# Patient Record
Sex: Male | Born: 1950 | Race: White | Hispanic: No | Marital: Single | Smoking: Never smoker
Health system: Southern US, Community
[De-identification: ages and names within clinical notes are randomized; demographics above are authoritative.]

## PROBLEM LIST (undated history)

## (undated) DIAGNOSIS — E119 Type 2 diabetes mellitus without complications: Secondary | ICD-10-CM

## (undated) DIAGNOSIS — E782 Mixed hyperlipidemia: Secondary | ICD-10-CM

## (undated) DIAGNOSIS — Z87442 Personal history of urinary calculi: Secondary | ICD-10-CM

## (undated) DIAGNOSIS — T8859XA Other complications of anesthesia, initial encounter: Secondary | ICD-10-CM

## (undated) DIAGNOSIS — I10 Essential (primary) hypertension: Secondary | ICD-10-CM

## (undated) DIAGNOSIS — T4145XA Adverse effect of unspecified anesthetic, initial encounter: Secondary | ICD-10-CM

## (undated) DIAGNOSIS — K219 Gastro-esophageal reflux disease without esophagitis: Secondary | ICD-10-CM

## (undated) DIAGNOSIS — M109 Gout, unspecified: Secondary | ICD-10-CM

## (undated) HISTORY — PX: WRIST SURGERY: SHX841

## (undated) HISTORY — PX: CHOLECYSTECTOMY: SHX55

---

## 1998-07-20 HISTORY — PX: CYSTOSCOPY W/ URETERAL STENT PLACEMENT: SHX1429

## 2004-04-24 ENCOUNTER — Emergency Department: Payer: Self-pay | Admitting: Internal Medicine

## 2005-06-17 ENCOUNTER — Emergency Department: Payer: Self-pay | Admitting: Internal Medicine

## 2006-07-15 ENCOUNTER — Emergency Department: Payer: Self-pay | Admitting: Emergency Medicine

## 2006-07-21 ENCOUNTER — Ambulatory Visit: Payer: Self-pay | Admitting: General Practice

## 2006-11-29 ENCOUNTER — Ambulatory Visit: Payer: Self-pay | Admitting: Pain Medicine

## 2006-12-02 ENCOUNTER — Ambulatory Visit: Payer: Self-pay | Admitting: Pain Medicine

## 2006-12-22 ENCOUNTER — Ambulatory Visit: Payer: Self-pay | Admitting: Pain Medicine

## 2007-02-07 ENCOUNTER — Ambulatory Visit: Payer: Self-pay | Admitting: Pain Medicine

## 2007-02-22 ENCOUNTER — Ambulatory Visit: Payer: Self-pay | Admitting: Pain Medicine

## 2007-03-08 ENCOUNTER — Ambulatory Visit: Payer: Self-pay | Admitting: Physician Assistant

## 2007-03-15 ENCOUNTER — Ambulatory Visit: Payer: Self-pay | Admitting: Pain Medicine

## 2007-03-31 ENCOUNTER — Ambulatory Visit: Payer: Self-pay | Admitting: Pain Medicine

## 2007-04-11 ENCOUNTER — Ambulatory Visit: Payer: Self-pay | Admitting: Pain Medicine

## 2007-05-11 ENCOUNTER — Ambulatory Visit: Payer: Self-pay | Admitting: Pain Medicine

## 2008-04-01 ENCOUNTER — Emergency Department: Payer: Self-pay | Admitting: Emergency Medicine

## 2008-08-22 ENCOUNTER — Emergency Department: Payer: Self-pay | Admitting: Emergency Medicine

## 2009-08-19 ENCOUNTER — Emergency Department: Payer: Self-pay | Admitting: Unknown Physician Specialty

## 2009-09-02 ENCOUNTER — Emergency Department: Payer: Self-pay | Admitting: Emergency Medicine

## 2009-09-06 ENCOUNTER — Ambulatory Visit: Payer: Self-pay | Admitting: Physician Assistant

## 2009-09-27 ENCOUNTER — Inpatient Hospital Stay: Payer: Self-pay | Admitting: Internal Medicine

## 2009-10-05 ENCOUNTER — Inpatient Hospital Stay: Payer: Self-pay | Admitting: Internal Medicine

## 2014-08-31 ENCOUNTER — Emergency Department: Payer: Self-pay | Admitting: Emergency Medicine

## 2014-09-03 ENCOUNTER — Emergency Department: Payer: Self-pay | Admitting: Student

## 2014-09-05 ENCOUNTER — Emergency Department: Payer: Self-pay | Admitting: Emergency Medicine

## 2014-09-07 ENCOUNTER — Emergency Department: Payer: Self-pay | Admitting: Emergency Medicine

## 2016-09-02 ENCOUNTER — Emergency Department: Payer: Self-pay

## 2016-09-02 ENCOUNTER — Emergency Department
Admission: EM | Admit: 2016-09-02 | Discharge: 2016-09-02 | Disposition: A | Discharge status: Home / Self Care | Payer: Self-pay | Attending: Emergency Medicine | Admitting: Emergency Medicine

## 2016-09-02 ENCOUNTER — Encounter: Payer: Self-pay | Admitting: Emergency Medicine

## 2016-09-02 DIAGNOSIS — N200 Calculus of kidney: Secondary | ICD-10-CM | POA: Insufficient documentation

## 2016-09-02 LAB — BASIC METABOLIC PANEL
Anion gap: 5 (ref 5–15)
BUN: 17 mg/dL (ref 6–20)
CALCIUM: 8.8 mg/dL — AB (ref 8.9–10.3)
CO2: 28 mmol/L (ref 22–32)
Chloride: 107 mmol/L (ref 101–111)
Creatinine, Ser: 1.19 mg/dL (ref 0.61–1.24)
GFR calc Af Amer: >60 mL/min (ref >60)
GLUCOSE: 245 mg/dL — AB (ref 65–99)
POTASSIUM: 4.4 mmol/L (ref 3.5–5.1)
SODIUM: 140 mmol/L (ref 135–145)

## 2016-09-02 LAB — CBC
HCT: 43.1 % (ref 40.0–52.0)
Hemoglobin: 14.6 g/dL (ref 13.0–18.0)
MCH: 31.4 pg (ref 26.0–34.0)
MCHC: 33.8 g/dL (ref 32.0–36.0)
MCV: 93.1 fL (ref 80.0–100.0)
PLATELETS: 279 10*3/uL (ref 150–440)
RBC: 4.63 MIL/uL (ref 4.40–5.90)
RDW: 13.9 % (ref 11.5–14.5)
WBC: 9.4 10*3/uL (ref 3.8–10.6)

## 2016-09-02 LAB — URINALYSIS, COMPLETE (UACMP) WITH MICROSCOPIC
BILIRUBIN URINE: NEGATIVE
Bacteria, UA: NONE SEEN
GLUCOSE, UA: 150 mg/dL — AB
KETONES UR: NEGATIVE mg/dL
LEUKOCYTES UA: NEGATIVE
NITRITE: NEGATIVE
PH: 7 (ref 5.0–8.0)
Protein, ur: NEGATIVE mg/dL
SPECIFIC GRAVITY, URINE: 1.012 (ref 1.005–1.030)
Squamous Epithelial / LPF: NONE SEEN

## 2016-09-02 MED ORDER — KETOROLAC TROMETHAMINE 30 MG/ML IJ SOLN
30.0000 mg | Freq: Once | INTRAMUSCULAR | Status: AC
  Administered 2016-09-02: 30 mg via INTRAVENOUS

## 2016-09-02 MED ORDER — SODIUM CHLORIDE 0.9 % IV BOLUS (SEPSIS)
1000.0000 mL | Freq: Once | INTRAVENOUS | Status: AC
  Administered 2016-09-02: 1000 mL via INTRAVENOUS

## 2016-09-02 MED ORDER — TAMSULOSIN HCL 0.4 MG PO CAPS: 0.4000 mg | ORAL_CAPSULE | Freq: Every day | ORAL | 0 refills | Status: DC

## 2016-09-02 MED ORDER — ONDANSETRON HCL 4 MG/2ML IJ SOLN
INTRAMUSCULAR | Status: AC
  Administered 2016-09-02: 4 mg via INTRAVENOUS
  Filled 2016-09-02: qty 2

## 2016-09-02 MED ORDER — MORPHINE SULFATE (PF) 2 MG/ML IV SOLN
INTRAVENOUS | Status: AC
  Administered 2016-09-02: 2 mg via INTRAVENOUS
  Filled 2016-09-02: qty 1

## 2016-09-02 MED ORDER — ONDANSETRON HCL 4 MG PO TABS: 4.0000 mg | ORAL_TABLET | Freq: Every day | ORAL | 1 refills | Status: AC | PRN

## 2016-09-02 MED ORDER — MORPHINE SULFATE (PF) 2 MG/ML IV SOLN
2.0000 mg | Freq: Once | INTRAVENOUS | Status: AC
  Administered 2016-09-02: 2 mg via INTRAVENOUS

## 2016-09-02 MED ORDER — OXYCODONE-ACETAMINOPHEN 5-325 MG PO TABS: 1.0000 | ORAL_TABLET | ORAL | 0 refills | Status: DC | PRN

## 2016-09-02 MED ORDER — ONDANSETRON HCL 4 MG/2ML IJ SOLN
4.0000 mg | Freq: Once | INTRAMUSCULAR | Status: AC
  Administered 2016-09-02: 4 mg via INTRAVENOUS

## 2016-09-02 MED ORDER — KETOROLAC TROMETHAMINE 30 MG/ML IJ SOLN
INTRAMUSCULAR | Status: AC
  Administered 2016-09-02: 30 mg via INTRAVENOUS
  Filled 2016-09-02: qty 1

## 2016-09-02 NOTE — ED Notes (Signed)
ED Provider at bedside. 

## 2016-09-02 NOTE — ED Provider Notes (Signed)
[facility] Emergency Department Provider Note   First MD Initiated Contact with Patient 09/02/16 0411     (approximate)  I have reviewed the triage vital signs and the nursing notes.   HISTORY  Chief Complaint Flank Pain    HPI Phillip Galloway is a 66 y.o. male with history of kidney stone presents to the emergency department with acute onset of left flank pain at 11:30 PM tonight. Patient also admits to nausea and one episode of nonbloody emesis. Patient denies any hematuria dysuria or fever. Patient states his current pain score is 8 out of 10.   Past Medical History:  Diagnosis Date  . Kidney stone     There are no active problems to display for this patient.   Past Surgical History:  Procedure Laterality Date  . CHOLECYSTECTOMY      Prior to Admission medications   Medication Sig Start Date End Date Taking? Authorizing Provider  ondansetron (ZOFRAN) 4 MG tablet Take 1 tablet (4 mg total) by mouth daily as needed for nausea or vomiting. 09/02/16 09/02/17  Gregor Hams, MD  oxyCODONE-acetaminophen (ROXICET) 5-325 MG tablet Take 1 tablet by mouth every 4 (four) hours as needed for severe pain. 09/02/16   Gregor Hams, MD  tamsulosin [facility]) 0.4 MG CAPS capsule Take 1 capsule (0.4 mg total) by mouth daily after breakfast. 09/02/16   Gregor Hams, MD    Allergies Darvon [propoxyphene]  No family history on file.  Social History Social History  Substance Use Topics  . Smoking status: Never Smoker  . Smokeless tobacco: Never Used  . Alcohol use No    Review of Systems Constitutional: No fever/chills Eyes: No visual changes. ENT: No sore throat. Cardiovascular: Denies chest pain. Respiratory: Denies shortness of breath. Gastrointestinal:Positive for left flank pain and vomiting  No diarrhea.  No constipation. Genitourinary: Negative for dysuria. Musculoskeletal: Positive for left flank pain Skin: Negative for  rash. Neurological: Negative for headaches, focal weakness or numbness.  10-point ROS otherwise negative.  ____________________________________________   PHYSICAL EXAM:  VITAL SIGNS: ED Triage Vitals  Enc Vitals Group     BP 09/02/16 0307 (!) 161/88     Pulse Rate 09/02/16 0307 78     Resp 09/02/16 0307 16     Temp 09/02/16 0307 98.3 F (36.8 C)     Temp Source 09/02/16 0307 Oral     SpO2 09/02/16 0307 96 %     Weight 09/02/16 0305 220 lb (99.8 kg)     Height 09/02/16 0305 5\' 9"  ([id number] m)     Head Circumference --      Peak Flow --      Pain Score 09/02/16 0305 4     Pain Loc --      Pain Edu? --      Excl. in [place]? --     Constitutional: Alert and oriented. Apparent discomfort Eyes: Conjunctivae are normal. PERRL. EOMI. Head: Atraumatic. Mouth/Throat: Mucous membranes are moist Neck: No stridor.   Cardiovascular: Normal rate, regular rhythm. Good peripheral circulation. Grossly normal heart sounds. Respiratory: Normal respiratory effort.  No retractions. Lungs CTAB. Gastrointestinal: Soft and nontender. No distention.  Musculoskeletal: No lower extremity tenderness nor edema. No gross deformities of extremities. Neurologic:  Normal speech and language. No gross focal neurologic deficits are appreciated.  Skin:  Skin is warm, dry and intact. No rash noted.  ____________________________________________   LABS (all labs ordered are listed, but only abnormal results are displayed)  Labs Reviewed  BASIC METABOLIC PANEL - Abnormal; Notable for the following:       Result Value   Glucose, Bld 245 (*)    Calcium 8.8 (*)    All other components within normal limits  URINALYSIS, COMPLETE (UACMP) WITH MICROSCOPIC - Abnormal; Notable for the following:    Color, Urine STRAW (*)    APPearance CLEAR (*)    Glucose, UA 150 (*)    Hgb urine dipstick LARGE (*)    All other components within normal limits  CBC    RADIOLOGY I, [place] Airen Stiehl, personally viewed and  evaluated these images (plain radiographs) as part of my medical decision making, as well as reviewing the written report by the radiologist.  Ct Renal Stone Study  Result Date: 09/02/2016 CLINICAL DATA:  Left flank pain radiating to the abdomen. Nausea. Previous history of kidney stones. EXAM: CT ABDOMEN AND PELVIS WITHOUT CONTRAST TECHNIQUE: Multidetector CT imaging of the abdomen and pelvis was performed following the standard protocol without IV contrast. COMPARISON:  10/05/2009 FINDINGS: Lower chest: The lung bases are clear. Hepatobiliary: No focal liver abnormality is seen. Status post cholecystectomy. No biliary dilatation. Pancreas: Unremarkable. No pancreatic ductal dilatation or surrounding inflammatory changes. Spleen: Normal in size without focal abnormality. Adrenals/Urinary Tract: No adrenal gland nodules. There is a stone in the bladder at the ureterovesical junction. Stone measures 3 mm in diameter. There is proximal left hydronephrosis and hydroureter with stranding around the left kidney and ureter. Multiple bilateral intrarenal stones. Right renal collecting system and ureter are decompressed. Bladder wall is not thickened. Multiple exophytic cysts on the right kidney without change. Stomach/Bowel: Stomach and small bowel are mostly decompressed. Scattered stool throughout the colon without abnormal distention. Diverticulosis of the sigmoid colon without evidence of diverticulitis. The appendix is normal. Vascular/Lymphatic: Aortic atherosclerosis. No enlarged abdominal or pelvic lymph nodes. Reproductive: Prostate gland is enlarged, measuring 5.1 cm diameter. Prostate calcifications are present. Other: No abdominal wall hernia or abnormality. No abdominopelvic ascites. Musculoskeletal: No acute or significant osseous findings. IMPRESSION: 3 mm stone at the left ureterovesical junction with moderate proximal obstruction. Multiple bilateral intrarenal nonobstructing stones. Prostate gland is  enlarged. Electronically Signed   By: Lucienne Capers M.D.   On: 09/02/2016 03:29     Procedures    INITIAL IMPRESSION / ASSESSMENT AND PLAN / ED COURSE  Pertinent labs & imaging results that were available during my care of the patient were reviewed by me and considered in my medical decision making (see chart for details).  66 year old male presented with history of physical exam consistent with possible kidney stone which was confirmed with CT scan. Patient received IV morphine and Toradol and Zofran with resolution of pain in the emergency department will be for referred to urology for further outpatient evaluation and management      ____________________________________________  FINAL CLINICAL IMPRESSION(S) / ED DIAGNOSES  Final diagnoses:  Kidney stone on left side     MEDICATIONS GIVEN DURING THIS VISIT:  Medications  morphine 2 MG/ML injection 2 mg (2 mg Intravenous Given 09/02/16 0452)  ketorolac (TORADOL) 30 MG/ML injection 30 mg (30 mg Intravenous Given 09/02/16 0451)  ondansetron (ZOFRAN) injection 4 mg (4 mg Intravenous Given 09/02/16 0452)  sodium chloride 0.9 % bolus 1,000 mL (0 mLs Intravenous Stopped 09/02/16 0644)     NEW OUTPATIENT MEDICATIONS STARTED DURING THIS VISIT:  New Prescriptions   ONDANSETRON (ZOFRAN) 4 MG TABLET    Take 1 tablet (4 mg total) by mouth  daily as needed for nausea or vomiting.   OXYCODONE-ACETAMINOPHEN (ROXICET) 5-325 MG TABLET    Take 1 tablet by mouth every 4 (four) hours as needed for severe pain.   TAMSULOSIN (FLOMAX) 0.4 MG CAPS CAPSULE    Take 1 capsule (0.4 mg total) by mouth daily after breakfast.    Modified Medications   No medications on file    Discontinued Medications   No medications on file     Note:  This document was prepared using Dragon voice recognition software and may include unintentional dictation errors.    Gregor Hams, MD 09/02/16 [phone]

## 2016-09-02 NOTE — ED Notes (Signed)
Patient has sleep apnea worse with laying on his back which was how he was sleeping.

## 2016-09-02 NOTE — ED Notes (Signed)
Patient transported to CT at this time. 

## 2016-09-02 NOTE — ED Triage Notes (Signed)
Patient ambulatory to triage with steady gait, without difficulty or distress noted; pt reports left flank pain radiating around to abd x hr accomp by nausea; st hx kidney stone

## 2016-09-02 NOTE — ED Notes (Signed)
Asked patient for urine specimen. Unable to provide at this time.

## 2016-11-27 ENCOUNTER — Emergency Department: Payer: Self-pay

## 2016-11-27 ENCOUNTER — Emergency Department
Admission: EM | Admit: 2016-11-27 | Discharge: 2016-11-27 | Disposition: A | Discharge status: Home / Self Care | Payer: Self-pay | Attending: Emergency Medicine | Admitting: Emergency Medicine

## 2016-11-27 ENCOUNTER — Encounter: Payer: Self-pay | Admitting: Emergency Medicine

## 2016-11-27 DIAGNOSIS — Y929 Unspecified place or not applicable: Secondary | ICD-10-CM | POA: Insufficient documentation

## 2016-11-27 DIAGNOSIS — X503XXA Overexertion from repetitive movements, initial encounter: Secondary | ICD-10-CM | POA: Insufficient documentation

## 2016-11-27 DIAGNOSIS — Y999 Unspecified external cause status: Secondary | ICD-10-CM | POA: Insufficient documentation

## 2016-11-27 DIAGNOSIS — Y939 Activity, unspecified: Secondary | ICD-10-CM | POA: Insufficient documentation

## 2016-11-27 DIAGNOSIS — S161XXA Strain of muscle, fascia and tendon at neck level, initial encounter: Secondary | ICD-10-CM | POA: Insufficient documentation

## 2016-11-27 DIAGNOSIS — R202 Paresthesia of skin: Secondary | ICD-10-CM

## 2016-11-27 MED ORDER — CYCLOBENZAPRINE HCL 5 MG PO TABS: 5.0000 mg | ORAL_TABLET | Freq: Three times a day (TID) | ORAL | 0 refills | Status: DC | PRN

## 2016-11-27 MED ORDER — DEXAMETHASONE SODIUM PHOSPHATE 10 MG/ML IJ SOLN
10.0000 mg | Freq: Once | INTRAMUSCULAR | Status: AC
  Administered 2016-11-27: 10 mg via INTRAMUSCULAR
  Filled 2016-11-27: qty 1

## 2016-11-27 MED ORDER — PREDNISONE 10 MG PO TABS: 10.0000 mg | ORAL_TABLET | Freq: Every day | ORAL | 0 refills | Status: DC

## 2016-11-27 NOTE — Discharge Instructions (Signed)
Your symptoms are consistent with a neck and upper back muscle strain. This has likely aggravated the nerves in the neck affecting the upper arm and hand. Take the prescription meds as directed. Apply ice or moist heat to reduce symptoms. Follow-up with [facility] or return to the ED as needed.

## 2016-11-27 NOTE — ED Provider Notes (Signed)
[facility] Emergency Department Provider Note ____________________________________________  Time seen: 83  I have reviewed the triage vital signs and the nursing notes.  HISTORY  Chief Complaint  Neck Pain and Numbness  HPI Phillip Galloway is a 66 y.o. male Presents to the ED for evaluation of right upper extremity paresthesias with pain to the scapular thoracic region of the right neck. He describes onset after he was reportedly usinga hand grinder for a prolonged period. He reports sanding with the arm extended. He reports distal paresthesias with a tingling along the thumb and first two fingers. He denies any grip changes or swelling. He denies any fevers, chills, sweats, or chest pain.   Past Medical History:  Diagnosis Date  . Kidney stone     There are no active problems to display for this patient.   Past Surgical History:  Procedure Laterality Date  . CHOLECYSTECTOMY      Prior to Admission medications   Medication Sig Start Date End Date Taking? Authorizing Provider  cyclobenzaprine (FLEXERIL) 5 MG tablet Take 1 tablet (5 mg total) by mouth 3 (three) times daily as needed for muscle spasms. 11/27/16   Javares Kaufhold, Dannielle Karvonen, PA-C  ondansetron (ZOFRAN) 4 MG tablet Take 1 tablet (4 mg total) by mouth daily as needed for nausea or vomiting. 09/02/16 09/02/17  Gregor Hams, MD  oxyCODONE-acetaminophen (ROXICET) 5-325 MG tablet Take 1 tablet by mouth every 4 (four) hours as needed for severe pain. 09/02/16   Gregor Hams, MD  predniSONE (DELTASONE) 10 MG tablet Take 1 tablet (10 mg total) by mouth daily with breakfast. 11/27/16   Aloysious Vangieson, Dannielle Karvonen, PA-C  tamsulosin (FLOMAX) 0.4 MG CAPS capsule Take 1 capsule (0.4 mg total) by mouth daily after breakfast. 09/02/16   Gregor Hams, MD    Allergies Darvon [propoxyphene]  History reviewed. No pertinent family history.  Social History Social History  Substance Use Topics  . Smoking  status: Never Smoker  . Smokeless tobacco: Never Used  . Alcohol use No    Review of Systems  Constitutional: Negative for fever. Cardiovascular: Negative for chest pain. Respiratory: Negative for shortness of breath. Musculoskeletal: Positive for upper back pain. Skin: Negative for rash. Neurological: Negative for headaches, focal weakness or numbness. Distal RUE paresthesias as above ____________________________________________  PHYSICAL EXAM:  VITAL SIGNS: ED Triage Vitals  Enc Vitals Group     BP 11/27/16 1820 (!) 153/96     Pulse Rate 11/27/16 1818 79     Resp 11/27/16 1818 18     Temp 11/27/16 1818 98.8 F (37.1 C)     Temp Source 11/27/16 1818 Oral     SpO2 11/27/16 1818 98 %     Weight 11/27/16 1819 210 lb (95.3 kg)     Height 11/27/16 1819 5\' 9"  (1.753 m)     Head Circumference --      Peak Flow --      Pain Score --      Pain Loc --      Pain Edu? --      Excl. in [place]? --    Constitutional: Alert and oriented. Well appearing and in no distress. Head: Normocephalic and atraumatic. Neck: Supple. No thyromegaly. Slightly decreased right rotation though range of motion. No crepitus appreciated. Cardiovascular: Normal rate, regular rhythm. Normal distal pulses. Respiratory: Normal respiratory effort. No wheezes/rales/rhonchi. Musculoskeletal: Normal spinal alignment without midline tenderness, spasm, deformity, step-off. Patient tender palpation to the right scapulothoracic region  between the thoracic spine and the right scapula border. Normal rotator cuff testing on exam. Normal composite fist bilaterally. Nontender with normal range of motion in all extremities.  Neurologic:  Normal gross sensation. Normal grip strength bilaterally. Normal UE DTRs bilaterally. Normal intrinsic and opposition testing. Normal speech and language. No gross focal neurologic deficits are appreciated. ____________________________________________   RADIOLOGY  Cervical  Spine IMPRESSION: Negative cervical spine radiographs.  I, Quantarius Genrich, Dannielle Karvonen, personally viewed and evaluated these images (plain radiographs) as part of my medical decision making, as well as reviewing the written report by the radiologist. ____________________________________________  PROCEDURES  Decadron 10 mg IM ____________________________________________  INITIAL IMPRESSION / ASSESSMENT AND PLAN / ED COURSE  Patient with right scapular thoracic muscle strain with distal paresthesias on presentation. He is reassured by his negative findings films. He has no significant weakness on exam. He'll be discharged with a prescription for prednisone and Flexeril doses directed. He is advised to follow with his provider at [facility] clinic for ongoing symptom management. Return precautions were reviewed. ____________________________________________  FINAL CLINICAL IMPRESSION(S) / ED DIAGNOSES  Final diagnoses:  Acute strain of neck muscle, initial encounter  Paresthesias      Carmie End, Dannielle Karvonen, PA-C 11/27/16 2016    Earleen Newport, MD 11/27/16 2039

## 2016-11-27 NOTE — ED Notes (Signed)
Reviewed d/c instructions, follow-up care, and prescriptions with patient. Pt verbalized understanding.

## 2016-11-27 NOTE — ED Triage Notes (Signed)
Pt reports having "crick" in neck that started a week ago.  Pain in right neck and shoulder and has now has tingling that started monday. Pain much worse when turns head to right or looks up and to right.  Tingling has improved per pt but still has a lot of pain when turning head.

## 2016-11-27 NOTE — ED Notes (Addendum)
See triage note, pt reports neck pain that began last Saturday and has since gotten worse as well as new onset of right arm pain with a tingling sensation. Pt denies injury. Pt has ROM to bilateral arms at this time. Pt reports tilting his head back and turning head to the right increases the pain. Pt A&O and in NAD at this time.

## 2017-10-25 ENCOUNTER — Emergency Department: Payer: Medicare Other

## 2017-10-25 ENCOUNTER — Emergency Department
Admission: EM | Admit: 2017-10-25 | Discharge: 2017-10-25 | Disposition: A | Discharge status: Home / Self Care | Payer: Medicare Other | Attending: Emergency Medicine | Admitting: Emergency Medicine

## 2017-10-25 ENCOUNTER — Encounter: Payer: Self-pay | Admitting: Emergency Medicine

## 2017-10-25 DIAGNOSIS — M25461 Effusion, right knee: Secondary | ICD-10-CM | POA: Diagnosis not present

## 2017-10-25 DIAGNOSIS — Z79899 Other long term (current) drug therapy: Secondary | ICD-10-CM | POA: Insufficient documentation

## 2017-10-25 DIAGNOSIS — R2241 Localized swelling, mass and lump, right lower limb: Secondary | ICD-10-CM | POA: Diagnosis present

## 2017-10-25 DIAGNOSIS — L539 Erythematous condition, unspecified: Secondary | ICD-10-CM

## 2017-10-25 LAB — CBC WITH DIFFERENTIAL/PLATELET
Basophils Absolute: 0.1 10*3/uL (ref 0–0.1)
Basophils Relative: 1 %
EOS PCT: 2 %
Eosinophils Absolute: 0.2 10*3/uL (ref 0–0.7)
HCT: 41.5 % (ref 40.0–52.0)
HEMOGLOBIN: 14 g/dL (ref 13.0–18.0)
LYMPHS ABS: 1.5 10*3/uL (ref 1.0–3.6)
LYMPHS PCT: 16 %
MCH: 30.9 pg (ref 26.0–34.0)
MCHC: 33.6 g/dL (ref 32.0–36.0)
MCV: 91.7 fL (ref 80.0–100.0)
Monocytes Absolute: 1.1 10*3/uL — ABNORMAL HIGH (ref 0.2–1.0)
Monocytes Relative: 12 %
NEUTROS PCT: 69 %
Neutro Abs: 6.7 10*3/uL — ABNORMAL HIGH (ref 1.4–6.5)
Platelets: 328 10*3/uL (ref 150–440)
RBC: 4.52 MIL/uL (ref 4.40–5.90)
RDW: 13.5 % (ref 11.5–14.5)
WBC: 9.5 10*3/uL (ref 3.8–10.6)

## 2017-10-25 LAB — BASIC METABOLIC PANEL
Anion gap: 6 (ref 5–15)
BUN: 14 mg/dL (ref 6–20)
CHLORIDE: 105 mmol/L (ref 101–111)
CO2: 26 mmol/L (ref 22–32)
Calcium: 8.6 mg/dL — ABNORMAL LOW (ref 8.9–10.3)
Creatinine, Ser: 1.01 mg/dL (ref 0.61–1.24)
GFR calc Af Amer: >60 mL/min (ref >60)
GFR calc non Af Amer: >60 mL/min (ref >60)
GLUCOSE: 111 mg/dL — AB (ref 65–99)
POTASSIUM: 4.1 mmol/L (ref 3.5–5.1)
Sodium: 137 mmol/L (ref 135–145)

## 2017-10-25 LAB — URIC ACID: Uric Acid, Serum: 6.4 mg/dL (ref 4.4–7.6)

## 2017-10-25 MED ORDER — NAPROXEN 375 MG PO TABS: 375.0000 mg | ORAL_TABLET | Freq: Two times a day (BID) | ORAL | 0 refills | Status: DC

## 2017-10-25 MED ORDER — CLINDAMYCIN PHOSPHATE 600 MG/50ML IV SOLN
600.0000 mg | Freq: Once | INTRAVENOUS | Status: AC
  Administered 2017-10-25: 600 mg via INTRAVENOUS
  Filled 2017-10-25: qty 50

## 2017-10-25 MED ORDER — KETOROLAC TROMETHAMINE 30 MG/ML IJ SOLN
30.0000 mg | Freq: Once | INTRAMUSCULAR | Status: AC
  Administered 2017-10-25: 30 mg via INTRAVENOUS
  Filled 2017-10-25: qty 1

## 2017-10-25 MED ORDER — CLINDAMYCIN HCL 150 MG PO CAPS: 150.0000 mg | ORAL_CAPSULE | Freq: Four times a day (QID) | ORAL | 0 refills | Status: DC

## 2017-10-25 NOTE — ED Triage Notes (Signed)
Pt reports thinks he may have gotten bit by a spider in his right knee. C/o swelling and pain since last Monday.

## 2017-10-25 NOTE — ED Provider Notes (Signed)
[facility] Emergency Department Provider Note   ____________________________________________   First MD Initiated Contact with Patient 10/25/17 1345     (approximate)  I have reviewed the triage vital signs and the nursing notes.   HISTORY  Chief Complaint Knee Pain    HPI Phillip Galloway is a 67 y.o. male patient presents with right knee edema and erythema.  Patient increased swelling and pain for 1 week.  Patient suspect insect bite.  Patient has a history of gout.  Patient denies fever this complaint.  Patient denies any drainage.  Patient rates pain 6/10.  Patient described the pain "achy".  No palliative measures for complaint.  Past Medical History:  Diagnosis Date  . Kidney stone     There are no active problems to display for this patient.   Past Surgical History:  Procedure Laterality Date  . CHOLECYSTECTOMY      Prior to Admission medications   Medication Sig Start Date End Date Taking? Authorizing Provider  clindamycin (CLEOCIN) 150 MG capsule Take 1 capsule (150 mg total) by mouth 4 (four) times daily. 10/25/17   Sable Feil, PA-C  cyclobenzaprine (FLEXERIL) 5 MG tablet Take 1 tablet (5 mg total) by mouth 3 (three) times daily as needed for muscle spasms. 11/27/16   Menshew, Dannielle Karvonen, PA-C  naproxen (NAPROSYN) 375 MG tablet Take 1 tablet (375 mg total) by mouth 2 (two) times daily with a meal. 10/25/17   Sable Feil, PA-C  oxyCODONE-acetaminophen (ROXICET) 5-325 MG tablet Take 1 tablet by mouth every 4 (four) hours as needed for severe pain. 09/02/16   Gregor Hams, MD  predniSONE (DELTASONE) 10 MG tablet Take 1 tablet (10 mg total) by mouth daily with breakfast. 11/27/16   Menshew, Dannielle Karvonen, PA-C  tamsulosin (FLOMAX) 0.4 MG CAPS capsule Take 1 capsule (0.4 mg total) by mouth daily after breakfast. 09/02/16   Gregor Hams, MD    Allergies Darvon [propoxyphene]  No family history on file.  Social History Social  History   Tobacco Use  . Smoking status: Never Smoker  . Smokeless tobacco: Never Used  Substance Use Topics  . Alcohol use: No  . Drug use: Not on file    Review of Systems Constitutional: No fever/chills Eyes: No visual changes. ENT: No sore throat. Cardiovascular: Denies chest pain. Respiratory: Denies shortness of breath. Gastrointestinal: No abdominal pain.  No nausea, no vomiting.  No diarrhea.  No constipation. Genitourinary: Negative for dysuria. Musculoskeletal: Right knee pain Skin: Negative for rash. Neurological: Negative for headaches, focal weakness or numbness. Allergic/Immunilogical: Darvon ____________________________________________   PHYSICAL EXAM:  VITAL SIGNS: ED Triage Vitals  Enc Vitals Group     BP 10/25/17 1212 (!) 148/92     Pulse Rate 10/25/17 1212 77     Resp 10/25/17 1212 20     Temp 10/25/17 1212 98.3 F (36.8 C)     Temp Source 10/25/17 1212 Oral     SpO2 10/25/17 1212 96 %     Weight 10/25/17 1211 215 lb (97.5 kg)     Height 10/25/17 1211 5\' 9"  (1.753 m)     Head Circumference --      Peak Flow --      Pain Score 10/25/17 1210 6     Pain Loc --      Pain Edu? --      Excl. in [place]? --     Constitutional: Alert and oriented. Well appearing and in  no acute distress. Cardiovascular: Normal rate, regular rhythm. Grossly normal heart sounds.  Good peripheral circulation. Respiratory: Normal respiratory effort.  No retractions. Lungs CTAB. Musculoskeletal: Edema and erythema anterior patella of the right leg. Neurologic:  Normal speech and language. No gross focal neurologic deficits are appreciated. No gait instability. Skin: Erythematous edematous area measuring approximately 7 cm of the anterior patella.  Skin is non-fluctuant.Marland Kitchen  Psychiatric: Mood and affect are normal. Speech and behavior are normal.  ____________________________________________   LABS (all labs ordered are listed, but only abnormal results are displayed)  Labs  Reviewed  CBC WITH DIFFERENTIAL/PLATELET - Abnormal; Notable for the following components:      Result Value   Neutro Abs 6.7 (*)    Monocytes Absolute 1.1 (*)    All other components within normal limits  BASIC METABOLIC PANEL - Abnormal; Notable for the following components:   Glucose, Bld 111 (*)    Calcium 8.6 (*)    All other components within normal limits  URIC ACID   ____________________________________________  EKG   ____________________________________________  RADIOLOGY    Official radiology report(s): [place] Rt Lower Extrem Ltd Soft Tissue Non Vascular  Result Date: 10/25/2017 CLINICAL DATA:  67 year old male with 1 week of erythema and swelling along the right knee/patella. EXAM: ULTRASOUND RIGHT LOWER EXTREMITY LIMITED TECHNIQUE: Ultrasound examination of the lower extremity soft tissues was performed in the area of clinical concern. COMPARISON:  No prior right knee imaging. FINDINGS: Grayscale and brief color Doppler interrogation of the soft tissues in the area of clinical concern along the anterior right knee. Evidence of widespread soft tissue edema, but no discrete fluid collection or lesion is identified with ultrasound. IMPRESSION: Generalized soft tissue edema. No discrete fluid collection/abscess. Electronically Signed   By: Genevie Ann M.D.   On: 10/25/2017 14:34    ____________________________________________   PROCEDURES  Procedure(s) performed: None  Procedures  Critical Care performed: No  ____________________________________________   INITIAL IMPRESSION / ASSESSMENT AND PLAN / ED COURSE  As part of my medical decision making, I reviewed the following data within the [place]    Patient presents with increasing edema erythema to the right knee for 1 week.  Differential diagnosis consists of gout, knee effusion, and cellulitis.  Discussed labs and imaging findings with patient.  Patient given discharge care instruction advised to  follow orthopedics for definitive evaluation and treatment.  Patient discharged with clindamycin and naproxen after receiving clindamycin IV prior to departure.      ____________________________________________   FINAL CLINICAL IMPRESSION(S) / ED DIAGNOSES  Final diagnoses:  Effusion of right knee     ED Discharge Orders        Ordered    clindamycin (CLEOCIN) 150 MG capsule  4 times daily     10/25/17 1528    naproxen (NAPROSYN) 375 MG tablet  2 times daily with meals     10/25/17 1528       Note:  This document was prepared using Dragon voice recognition software and may include unintentional dictation errors.    Sable Feil, PA-C 10/25/17 1533    Harvest Dark, MD 10/26/17 1517

## 2017-10-25 NOTE — Discharge Instructions (Addendum)
Take medication as directed and follow-up on-call orthopedic department in the morning.

## 2017-10-25 NOTE — ED Notes (Signed)
Patient transported to Ultrasound 

## 2017-10-25 NOTE — ED Notes (Signed)
See triage note   Presents with right knee pain  States he thinks he have been bitten by something  Small red area to knee  Denies any injury  Ambulates with limp d/t pain

## 2017-12-28 ENCOUNTER — Inpatient Hospital Stay: Admission: RE | Admit: 2017-12-28 | Payer: Medicare Other | Source: Ambulatory Visit

## 2017-12-29 ENCOUNTER — Other Ambulatory Visit: Payer: Self-pay

## 2017-12-29 ENCOUNTER — Encounter
Admission: RE | Admit: 2017-12-29 | Discharge: 2017-12-29 | Disposition: A | Discharge status: Home / Self Care | Payer: Medicare Other | Source: Ambulatory Visit | Attending: Orthopedic Surgery | Admitting: Orthopedic Surgery

## 2017-12-29 HISTORY — DX: Gastro-esophageal reflux disease without esophagitis: K21.9

## 2017-12-29 HISTORY — DX: Personal history of urinary calculi: Z87.442

## 2017-12-29 HISTORY — DX: Adverse effect of unspecified anesthetic, initial encounter: T41.45XA

## 2017-12-29 HISTORY — DX: Other complications of anesthesia, initial encounter: T88.59XA

## 2017-12-29 NOTE — Patient Instructions (Signed)
Your procedure is scheduled on: 01-06-18 THURSDAY Report to Same Day Surgery 2nd floor medical mall [facility] Entrance-take elevator on left to 2nd floor.  Check in with surgery information desk.) To find out your arrival time please call [phone] between 1PM - 3PM on 01-05-18 [facility]  Remember: Instructions that are not followed completely may result in serious medical risk, up to and including death, or upon the discretion of your surgeon and anesthesiologist your surgery may need to be rescheduled.    _x___ 1. Do not eat food after midnight the night before your procedure. NO GUM OR CANDY AFTER MIDNIGHT.  You may drink clear liquids up to 2 hours before you are scheduled to arrive at the hospital for your procedure.  Do not drink clear liquids within 2 hours of your scheduled arrival to the hospital.  Clear liquids include  --Water or Apple juice without pulp  --Clear carbohydrate beverage such as ClearFast or Gatorade  --Black Coffee or Clear Tea (No milk, no creamers, do not add anything to the coffee or Tea     __x__ 2. No Alcohol for 24 hours before or after surgery.   __x__3. No Smoking or e-cigarettes for 24 prior to surgery.  Do not use any chewable tobacco products for at least 6 hour prior to surgery   ____  4. Bring all medications with you on the day of surgery if instructed.    __x__ 5. Notify your doctor if there is any change in your medical condition     (cold, fever, infections).    x___6. On the morning of surgery brush your teeth with toothpaste and water.  You may rinse your mouth with mouth wash if you wish.  Do not swallow any toothpaste or mouthwash.   Do not wear jewelry, make-up, hairpins, clips or nail polish.  Do not wear lotions, powders, or perfumes. You may wear deodorant.  Do not shave 48 hours prior to surgery. Men may shave face and neck.  Do not bring valuables to the hospital.    [facility] is not responsible for any belongings or  valuables.               Contacts, dentures or bridgework may not be worn into surgery.  Leave your suitcase in the car. After surgery it may be brought to your room.  For patients admitted to the hospital, discharge time is determined by your treatment team.  _  Patients discharged the day of surgery will not be allowed to drive home.  You will need someone to drive you home and stay with you the night of your procedure.    Please read over the following fact sheets that you were given:   [facility] Preparing for Surgery and or MRSA Information   ____ Take anti-hypertensive listed below, cardiac, seizure, asthma, anti-reflux and psychiatric medicines. These include:  1. NONE  2.  3.  4.  5.  6.  ____Fleets enema or Magnesium Citrate as directed.   ____ Use CHG Soap or sage wipes as directed on instruction sheet   ____ Use inhalers on the day of surgery and bring to hospital day of surgery  ____ Stop Metformin and Janumet 2 days prior to surgery.    ____ Take 1/2 of usual insulin dose the night before surgery and none on the morning surgery.   ____ Follow recommendations from Cardiologist, Pulmonologist or PCP regarding stopping Aspirin, Coumadin, Plavix ,Eliquis, Effient, or Pradaxa, and Pletal.  X____Stop  Anti-inflammatories such as Advil, Aleve, Ibuprofen, Motrin, Naproxen, Naprosyn, Goodies powders or aspirin products NOW-OK to take Tylenol    ____ Stop supplements until after surgery.   ____ Bring C-Pap to the hospital.

## 2017-12-30 ENCOUNTER — Inpatient Hospital Stay: Admission: RE | Admit: 2017-12-30 | Payer: Medicare Other | Source: Ambulatory Visit

## 2018-01-04 ENCOUNTER — Encounter
Admission: RE | Admit: 2018-01-04 | Discharge: 2018-01-04 | Disposition: A | Discharge status: Home / Self Care | Payer: Medicare Other | Source: Ambulatory Visit | Attending: Orthopedic Surgery | Admitting: Orthopedic Surgery

## 2018-01-04 DIAGNOSIS — M7041 Prepatellar bursitis, right knee: Secondary | ICD-10-CM | POA: Diagnosis not present

## 2018-01-04 DIAGNOSIS — M71561 Other bursitis, not elsewhere classified, right knee: Secondary | ICD-10-CM | POA: Diagnosis present

## 2018-01-06 ENCOUNTER — Ambulatory Visit: Payer: Medicare Other | Admitting: Certified Registered Nurse Anesthetist

## 2018-01-06 ENCOUNTER — Ambulatory Visit
Admission: RE | Admit: 2018-01-06 | Discharge: 2018-01-06 | Disposition: A | Discharge status: Home / Self Care | Payer: Medicare Other | Source: Ambulatory Visit | Attending: Orthopedic Surgery | Admitting: Orthopedic Surgery

## 2018-01-06 ENCOUNTER — Other Ambulatory Visit: Payer: Self-pay

## 2018-01-06 ENCOUNTER — Encounter
Admission: RE | Disposition: A | Discharge status: Home / Self Care | Payer: Self-pay | Source: Ambulatory Visit | Attending: Orthopedic Surgery

## 2018-01-06 DIAGNOSIS — M7041 Prepatellar bursitis, right knee: Secondary | ICD-10-CM | POA: Insufficient documentation

## 2018-01-06 HISTORY — PX: PATELLAR TENDON REPAIR: SHX737

## 2018-01-06 SURGERY — REPAIR, TENDON, PATELLAR
Anesthesia: General | Laterality: Right

## 2018-01-06 MED ORDER — FAMOTIDINE 20 MG PO TABS
20.0000 mg | ORAL_TABLET | Freq: Once | ORAL | Status: AC
  Administered 2018-01-06: 20 mg via ORAL

## 2018-01-06 MED ORDER — CEFAZOLIN SODIUM-DEXTROSE 2-4 GM/100ML-% IV SOLN
INTRAVENOUS | Status: AC
  Filled 2018-01-06: qty 100

## 2018-01-06 MED ORDER — PROPOFOL 500 MG/50ML IV EMUL
INTRAVENOUS | Status: AC
  Filled 2018-01-06: qty 50

## 2018-01-06 MED ORDER — METOCLOPRAMIDE HCL 10 MG PO TABS: 5.0000 mg | ORAL_TABLET | Freq: Three times a day (TID) | ORAL | Status: DC | PRN

## 2018-01-06 MED ORDER — MIDAZOLAM HCL 5 MG/5ML IJ SOLN
INTRAMUSCULAR | Status: DC | PRN
  Administered 2018-01-06: 2 mg via INTRAVENOUS

## 2018-01-06 MED ORDER — HYDROCODONE-ACETAMINOPHEN 5-325 MG PO TABS: 1.0000 | ORAL_TABLET | ORAL | Status: DC | PRN

## 2018-01-06 MED ORDER — FENTANYL CITRATE (PF) 100 MCG/2ML IJ SOLN
25.0000 ug | INTRAMUSCULAR | Status: DC | PRN
  Administered 2018-01-06: 25 ug via INTRAVENOUS

## 2018-01-06 MED ORDER — KETAMINE HCL 50 MG/ML IJ SOLN
INTRAMUSCULAR | Status: DC | PRN
  Administered 2018-01-06: 50 mg via INTRAMUSCULAR

## 2018-01-06 MED ORDER — BUPIVACAINE HCL (PF) 0.5 % IJ SOLN
INTRAMUSCULAR | Status: DC | PRN
  Administered 2018-01-06: 30 mL

## 2018-01-06 MED ORDER — BUPIVACAINE HCL (PF) 0.5 % IJ SOLN
INTRAMUSCULAR | Status: AC
  Filled 2018-01-06: qty 10

## 2018-01-06 MED ORDER — DEXAMETHASONE SODIUM PHOSPHATE 10 MG/ML IJ SOLN
INTRAMUSCULAR | Status: DC | PRN
  Administered 2018-01-06: 10 mg via INTRAVENOUS

## 2018-01-06 MED ORDER — MIDAZOLAM HCL 2 MG/2ML IJ SOLN
INTRAMUSCULAR | Status: AC
Start: 2018-01-06 — End: ?
  Filled 2018-01-06: qty 2

## 2018-01-06 MED ORDER — FENTANYL CITRATE (PF) 100 MCG/2ML IJ SOLN
INTRAMUSCULAR | Status: AC
  Filled 2018-01-06: qty 2

## 2018-01-06 MED ORDER — SODIUM CHLORIDE 0.9 % IV SOLN: INTRAVENOUS | Status: DC

## 2018-01-06 MED ORDER — ONDANSETRON HCL 4 MG/2ML IJ SOLN
INTRAMUSCULAR | Status: DC | PRN
  Administered 2018-01-06: 4 mg via INTRAVENOUS

## 2018-01-06 MED ORDER — ONDANSETRON HCL 4 MG PO TABS: 4.0000 mg | ORAL_TABLET | Freq: Four times a day (QID) | ORAL | Status: DC | PRN

## 2018-01-06 MED ORDER — CEFAZOLIN SODIUM-DEXTROSE 2-4 GM/100ML-% IV SOLN
2.0000 g | Freq: Once | INTRAVENOUS | Status: AC
  Administered 2018-01-06: 2 g via INTRAVENOUS

## 2018-01-06 MED ORDER — METOCLOPRAMIDE HCL 5 MG/ML IJ SOLN: 5.0000 mg | Freq: Three times a day (TID) | INTRAMUSCULAR | Status: DC | PRN

## 2018-01-06 MED ORDER — ONDANSETRON HCL 4 MG/2ML IJ SOLN
4.0000 mg | Freq: Once | INTRAMUSCULAR | Status: AC | PRN
  Administered 2018-01-06: 4 mg via INTRAVENOUS

## 2018-01-06 MED ORDER — PROPOFOL 10 MG/ML IV BOLUS
INTRAVENOUS | Status: DC | PRN
  Administered 2018-01-06: 160 mg via INTRAVENOUS

## 2018-01-06 MED ORDER — ONDANSETRON HCL 4 MG/2ML IJ SOLN: 4.0000 mg | Freq: Four times a day (QID) | INTRAMUSCULAR | Status: DC | PRN

## 2018-01-06 MED ORDER — LACTATED RINGERS IV SOLN
INTRAVENOUS | Status: DC
  Administered 2018-01-06: 10:00:00 via INTRAVENOUS

## 2018-01-06 MED ORDER — FENTANYL CITRATE (PF) 100 MCG/2ML IJ SOLN
INTRAMUSCULAR | Status: DC | PRN
  Administered 2018-01-06: 25 ug via INTRAVENOUS

## 2018-01-06 MED ORDER — FAMOTIDINE 20 MG PO TABS
ORAL_TABLET | ORAL | Status: AC
  Administered 2018-01-06: 20 mg via ORAL
  Filled 2018-01-06: qty 1

## 2018-01-06 MED ORDER — NEOMYCIN-POLYMYXIN B GU 40-200000 IR SOLN
Status: DC | PRN
  Administered 2018-01-06: 4 mL

## 2018-01-06 MED ORDER — HYDROCODONE-ACETAMINOPHEN 5-325 MG PO TABS: 1.0000 | ORAL_TABLET | Freq: Four times a day (QID) | ORAL | 0 refills | Status: DC | PRN

## 2018-01-06 MED ORDER — ONDANSETRON HCL 4 MG/2ML IJ SOLN
INTRAMUSCULAR | Status: AC
  Filled 2018-01-06: qty 2

## 2018-01-06 SURGICAL SUPPLY — 28 items
BANDAGE ELASTIC 6 LF NS (GAUZE/BANDAGES/DRESSINGS) ×3 IMPLANT
CANISTER SUCT 1200ML W/VALVE (MISCELLANEOUS) ×3 IMPLANT
CHLORAPREP W/TINT 26ML (MISCELLANEOUS) ×3 IMPLANT
CUFF TOURN 24 STER (MISCELLANEOUS) IMPLANT
CUFF TOURN 30 STER DUAL PORT (MISCELLANEOUS) IMPLANT
ELECT CAUTERY BLADE 6.4 (BLADE) ×3 IMPLANT
ELECT REM PT RETURN 9FT ADLT (ELECTROSURGICAL) ×3
ELECTRODE REM PT RTRN 9FT ADLT (ELECTROSURGICAL) ×1 IMPLANT
GAUZE PETRO XEROFOAM 1X8 (MISCELLANEOUS) ×3 IMPLANT
GAUZE SPONGE 4X4 12PLY STRL (GAUZE/BANDAGES/DRESSINGS) ×3 IMPLANT
GLOVE SURG SYN 9.0  PF PI (GLOVE) ×2
GLOVE SURG SYN 9.0 PF PI (GLOVE) ×1 IMPLANT
GOWN SRG 2XL LVL 4 RGLN SLV (GOWNS) ×1 IMPLANT
GOWN STRL NON-REIN 2XL LVL4 (GOWNS) ×2
GOWN STRL REUS W/ TWL LRG LVL3 (GOWN DISPOSABLE) ×1 IMPLANT
GOWN STRL REUS W/TWL LRG LVL3 (GOWN DISPOSABLE) ×2
IMMOB KNEE 24 THIGH 24 443303 (SOFTGOODS) ×3 IMPLANT
KIT TURNOVER KIT A (KITS) ×3 IMPLANT
NS IRRIG 500ML POUR BTL (IV SOLUTION) ×3 IMPLANT
PACK TOTAL KNEE (MISCELLANEOUS) ×3 IMPLANT
PAD ABD DERMACEA PRESS 5X9 (GAUZE/BANDAGES/DRESSINGS) ×3 IMPLANT
STAPLER SKIN PROX 35W (STAPLE) ×3 IMPLANT
SUT VIC AB 0 CT1 27 (SUTURE) ×2
SUT VIC AB 0 CT1 27XCR 8 STRN (SUTURE) ×1 IMPLANT
SUT VIC AB 0 CT1 36 (SUTURE) ×3 IMPLANT
SUT VIC AB 2-0 CT1 27 (SUTURE) ×2
SUT VIC AB 2-0 CT1 TAPERPNT 27 (SUTURE) ×1 IMPLANT
SYR 10ML LL (SYRINGE) ×3 IMPLANT

## 2018-01-06 NOTE — H&P (Signed)
Chief Complaint  Patient presents with  . Knee Pain  Right knee pain   Phillip Galloway is a 67 y.o. male who presents today for evaluation of right knee pain. He has a history of gout. Patient has had 10-15 years of right anterior knee pain swelling. He has worked in [employer] his entire life and is performing a lot of kneeling. Occasionally he will have increased swelling to the right anterior knee that causes him discomfort. Most recently he was seen in the ER for significant swelling and redness to the anterior aspect of the right knee. Ultrasound was negative for any DVT and did not show any type of knee effusion or Baker's cyst. He was placed on clindamycin due to the redness and states today his knee is back to its baseline which is moderately swollen and slightly tender to palpation. He denies any drainage, fevers, pain to touch. Patient denies any generalized knee pain, posterior knee swelling, buckling, catching, locking, giving way sensation throughout the knee.  Past Medical History: Past Medical History:  Diagnosis Date  . Kidney stones   Past Surgical History: Past Surgical History:  Procedure Laterality Date  . CHOLECYSTECTOMY   Past Family History: Family History  Problem Relation Age of Onset  . No Known Problems Mother   Medications: Current Outpatient Medications Ordered in Epic  Medication Sig Dispense Refill  . clindamycin (CLEOCIN HCL) 150 MG capsule Take by mouth  . naproxen (NAPROSYN) 375 MG tablet Take by mouth   No current Epic-ordered facility-administered medications on file.   Allergies: Allergies  Allergen Reactions  . Codeine Shortness Of Breath  . Propoxyphene Shortness Of Breath    Review of Systems:  A comprehensive 14 point ROS was performed, reviewed by me today, and the pertinent orthopaedic findings are documented in the HPI.  Exam: BP 130/76 (BP Location: Left upper arm, Patient Position: Sitting, BP Cuff Size: Adult)  Ht 175.3 cm (5\' 9" )  Wt  97.5 kg (215 lb)  BMI 31.75 kg/m  General:  Well developed, well nourished, no apparent distress, normal affect, normal gait with no antalgic component.   HEENT: Head normocephalic, atraumatic, PERRL.   Abdomen: Soft, non tender, non distended, Bowel sounds present.  Heart: Examination of the heart reveals regular, rate, and rhythm. There is no murmur noted on ascultation. There is a normal apical pulse.  Lungs: Lungs are clear to auscultation. There is no wheeze, rhonchi, or crackles. There is normal expansion of bilateral chest walls.   Right knee: Examination of the right knee shows patient has 0-110 degrees range of motion. He is able to actively straight leg raise. He has moderate soft tissue swelling to the prepatellar bursa area with no fluctuance, warmth or redness. Prepatellar bursa is minimally tender to palpation. Knee is stable to valgus varus stress testing. He is nontender along the medial and lateral joint line of the knee. Knee is stable to valgus and varus stress testing.  Imaging: AP lateral and sunrise views of the right knee are ordered and interpreted by me in the office E. Impression: Patient has mild joint space narrowing in the medial compartment with mild subchondral changes noted in the medial lateral compartment. Large spur along the anterior patellar surface is present. Patella tracks slightly lateral and the trochlear groove with moderate patellofemoral osteoarthritis is noted.  Impression: Prepatellar bursitis of right knee [M70.41] Prepatellar bursitis of right knee (primary encounter diagnosis)  Plan:  1. Risks, benefits, complications of a right knee prepatellar bursa excision  with anterior patella spur excision were discussed with the patient. Patient has agreed and consented to procedure  today.    Hessie Knows, MD

## 2018-01-06 NOTE — Anesthesia Preprocedure Evaluation (Signed)
Anesthesia Evaluation  Patient identified by MRN, date of birth, ID band Patient awake    Reviewed: Allergy & Precautions, H&P , NPO status , Patient's Chart, lab work & pertinent test results, reviewed documented beta blocker date and time   History of Anesthesia Complications (+) PROLONGED EMERGENCE and history of anesthetic complications  Airway Mallampati: III  TM Distance: >3 FB Neck ROM: full    Dental  (+) Dental Advidsory Given, Missing, Teeth Intact, Chipped   Pulmonary neg pulmonary ROS,           Cardiovascular Exercise Tolerance: Good negative cardio ROS       Neuro/Psych negative neurological ROS  negative psych ROS   GI/Hepatic Neg liver ROS, GERD  ,  Endo/Other  negative endocrine ROS  Renal/GU Renal disease (kidney stones)  negative genitourinary   Musculoskeletal   Abdominal   Peds  Hematology negative hematology ROS (+)   Anesthesia Other Findings Past Medical History: No date: Complication of anesthesia     Comment:  SLIGHTLY hard to wake up No date: GERD (gastroesophageal reflux disease)     Comment:  occ-no meds No date: History of kidney stones     Comment:  h/o   Reproductive/Obstetrics negative OB ROS                             Anesthesia Physical Anesthesia Plan  ASA: II  Anesthesia Plan: General   Post-op Pain Management:    Induction: Intravenous  PONV Risk Score and Plan: 2 and Ondansetron and Dexamethasone  Airway Management Planned: LMA  Additional Equipment:   Intra-op Plan:   Post-operative Plan: Extubation in OR  Informed Consent: I have reviewed the patients History and Physical, chart, labs and discussed the procedure including the risks, benefits and alternatives for the proposed anesthesia with the patient or authorized representative who has indicated his/her understanding and acceptance.   Dental Advisory Given  Plan  Discussed with: Anesthesiologist, CRNA and Surgeon  Anesthesia Plan Comments:         Anesthesia Quick Evaluation

## 2018-01-06 NOTE — OR Nursing (Signed)
Discharge instructions discussed with pt and daughter. Both voice understanding. 

## 2018-01-06 NOTE — Anesthesia Procedure Notes (Addendum)
Procedure Name: LMA Insertion Date/Time: 01/06/2018 11:55 AM Performed by: Loleta Books, RN Pre-anesthesia Checklist: Patient identified, Emergency Drugs available, Suction available, Patient being monitored and Timeout performed Patient Re-evaluated:Patient Re-evaluated prior to induction Oxygen Delivery Method: Circle system utilized Preoxygenation: Pre-oxygenation with 100% oxygen Induction Type: IV induction LMA: LMA inserted LMA Size: 4.5 Number of attempts: 1 Placement Confirmation: positive ETCO2 and breath sounds checked- equal and bilateral Tube secured with: Tape Dental Injury: Teeth and Oropharynx as per pre-operative assessment

## 2018-01-06 NOTE — Discharge Instructions (Addendum)
Take it easy through the weekend without trying to bend your knee very much.  Keep leg propped up to decrease swelling.  Resume normal medications.  Take pain medicine as needed.  Ice to front of the knee may help with pain today and tomorrow.  AMBULATORY SURGERY  DISCHARGE INSTRUCTIONS   1) The drugs that you were given will stay in your system until tomorrow so for the next 24 hours you should not:  A) Drive an automobile B) Make any legal decisions C) Drink any alcoholic beverage   2) You may resume regular meals tomorrow.  Today it is better to start with liquids and gradually work up to solid foods.  You may eat anything you prefer, but it is better to start with liquids, then soup and crackers, and gradually work up to solid foods.   3) Please notify your doctor immediately if you have any unusual bleeding, trouble breathing, redness and pain at the surgery site, drainage, fever, or pain not relieved by medication.    4) Additional Instructions:        Please contact your physician with any problems or Same Day Surgery at [phone], Monday through Friday 6 am to 4 pm, or [place] at [facility] number at [phone].

## 2018-01-06 NOTE — Transfer of Care (Signed)
Immediate Anesthesia Transfer of Care Note  Patient: Phillip LIPE Sr.  Procedure(s) Performed: RIGHT KNEE PREPATELLAR BURSA EXCISION WITH ANTERIOR PATELLA SPUR EXCISION (Right )  Patient Location: PACU  Anesthesia Type:General  Level of Consciousness: drowsy  Airway & Oxygen Therapy: Patient Spontanous Breathing and Patient connected to face mask  Post-op Assessment: Report given to RN and Post -op Vital signs reviewed and stable  Post vital signs: Reviewed and stable  Last Vitals:  Vitals Value Taken Time  BP 136/88 01/06/2018 12:32 PM  Temp    Pulse 67 01/06/2018 12:34 PM  Resp 18 01/06/2018 12:34 PM  SpO2 100 % 01/06/2018 12:34 PM  Vitals shown include unvalidated device data.  Last Pain:  Vitals:   01/06/18 1232  TempSrc:   PainSc: (P) Asleep         Complications: No apparent anesthesia complications

## 2018-01-06 NOTE — Anesthesia Post-op Follow-up Note (Signed)
Anesthesia QCDR form completed.        

## 2018-01-06 NOTE — Op Note (Signed)
01/06/2018  12:32 PM  PATIENT:  Phillip Buzzard Sr.  67 y.o. male  PRE-OPERATIVE DIAGNOSIS:  PREPATELLAR BURSITIS OF RIGHT KNEE  POST-OPERATIVE DIAGNOSIS:  PREPATELLAR BURSITIS OF RIGHT KNEE  PROCEDURE:  Procedure(s): RIGHT KNEE PREPATELLAR BURSA EXCISION WITH ANTERIOR PATELLA SPUR EXCISION (Right)  SURGEON: Laurene Footman, MD  ASSISTANTS: None  ANESTHESIA:   general  EBL:  Total I/O In: 39 [I.V.:650] Out: 5 [Blood:5]  BLOOD ADMINISTERED:none  DRAINS: none   LOCAL MEDICATIONS USED:  MARCAINE     SPECIMEN:  Source of Specimen:  Crystals from the anterior knee presumed to be gout  DISPOSITION OF SPECIMEN:  PATHOLOGY  COUNTS:  YES  TOURNIQUET:  * Missing tourniquet times found for documented tourniquets in log: [id number] *  IMPLANTS: None  DICTATION: .Dragon Dictation patient brought the operating room and after adequate anesthesia was obtained the right leg prepped review sterile fashion.  After patient identification and timeout procedure were completed tourniquet was raised and a midline skin incision was made.  The bursa was entered there is a moderate amount of fluid which was clear and did not appear infected.  There is also a large amount of crystalline material portion of this was sent as a specimen to check for gout.  The skin surrounding the bursa anteriorly was also quite thick and so this was excised and the incision extended to allow for adequate resection of this very thick skin in the anterior knee.  There was a bony spur present on the anterior patella and this was debrided with use of Roger to get a smooth surface anteriorly.  After adequate resection of the bursa and thickened tissue and he was thoroughly irrigated cautery was used to gain hemostasis and the wound was closed with 2-0 Vicryl deep to try to close down the dead space 3-0 Vicryl subcutaneously in a running fashion of the skin staples 20 cc of half percent Sensorcaine infiltrated for postop analgesia.   Xeroform 4 x 4's web roll and Ace wrap were applied  PLAN OF CARE: Discharge to home after PACU  PATIENT DISPOSITION:  PACU - hemodynamically stable.

## 2018-01-07 ENCOUNTER — Encounter: Payer: Self-pay | Admitting: Orthopedic Surgery

## 2018-01-07 NOTE — Anesthesia Postprocedure Evaluation (Signed)
Anesthesia Post Note  Patient: Phillip ROTHERT Sr.  Procedure(s) Performed: RIGHT KNEE PREPATELLAR BURSA EXCISION WITH ANTERIOR PATELLA SPUR EXCISION (Right )  Patient location during evaluation: PACU Anesthesia Type: General Level of consciousness: awake and alert Pain management: pain level controlled Vital Signs Assessment: post-procedure vital signs reviewed and stable Respiratory status: spontaneous breathing, nonlabored ventilation, respiratory function stable and patient connected to nasal cannula oxygen Cardiovascular status: blood pressure returned to baseline and stable Postop Assessment: no apparent nausea or vomiting Anesthetic complications: no     Last Vitals:  Vitals:   01/06/18 1350 01/06/18 1423  BP: 140/84 137/73  Pulse: 66 67  Resp: 14 16  Temp:    SpO2: 96% 97%    Last Pain:  Vitals:   01/06/18 1332  TempSrc:   PainSc: 0-No pain                 Martha Clan

## 2018-02-02 IMAGING — CT CT RENAL STONE PROTOCOL
2 of 4 series · 16 of 46 positions shown, 18 images · non-contrast
Comparison: 10/05/2009

CLINICAL DATA: Left flank pain radiating to the abdomen. Nausea.
Previous history of kidney stones.

EXAM:
CT ABDOMEN AND PELVIS WITHOUT CONTRAST
TECHNIQUE: Multidetector CT imaging of the abdomen and pelvis was performed
following the standard protocol without IV contrast.

[Series 2: stone full standard · axial · 0.86mm/px · z∈[-897,-427]mm · 13 of 104 slices shown, 15 images]
[im 5/104  soft-tissue]
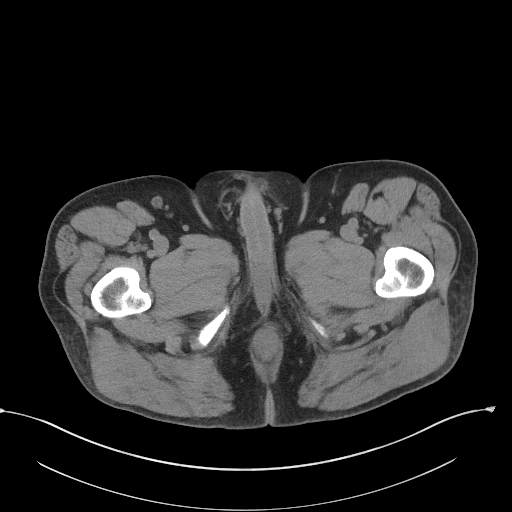
[im 5/104  bone]
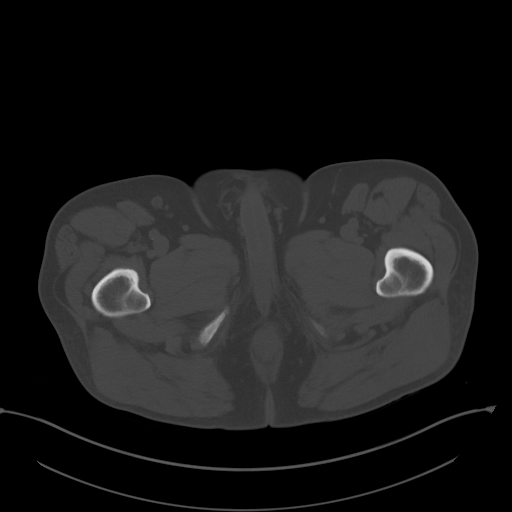
[im 13/104  soft-tissue]
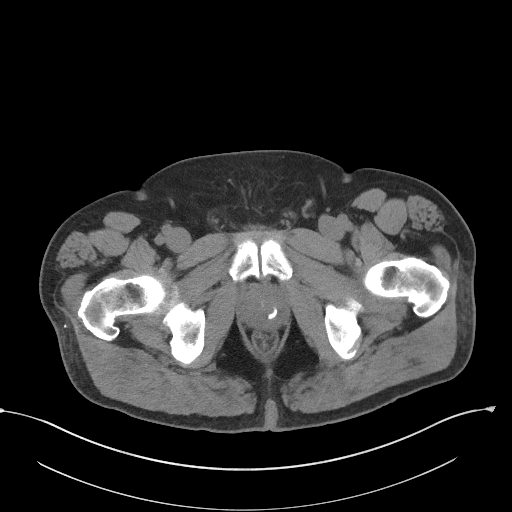
[im 22/104  soft-tissue]
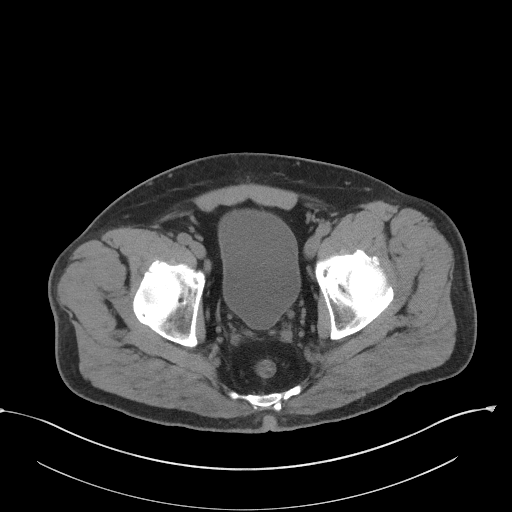
[im 31/104  soft-tissue]
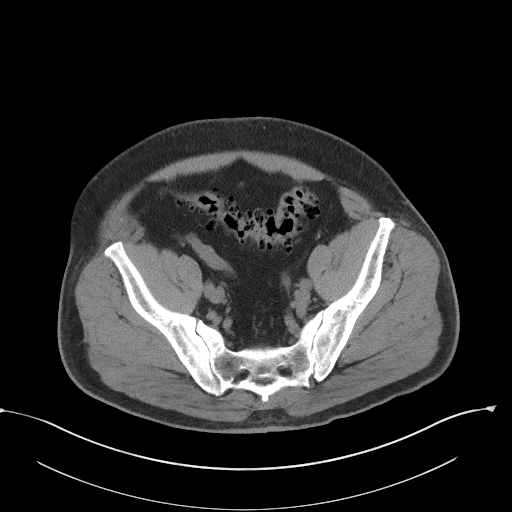
[im 35/104  soft-tissue]
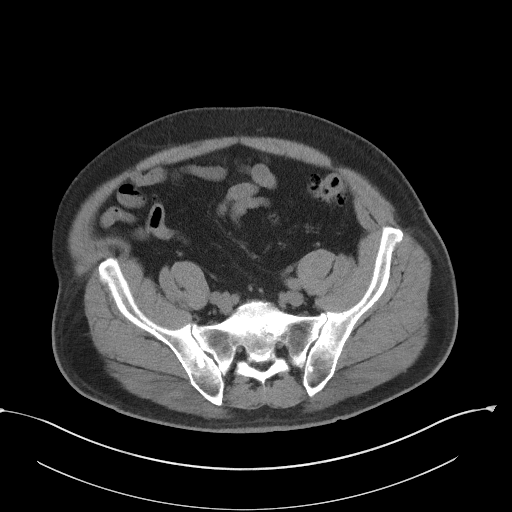
[im 43/104  soft-tissue]
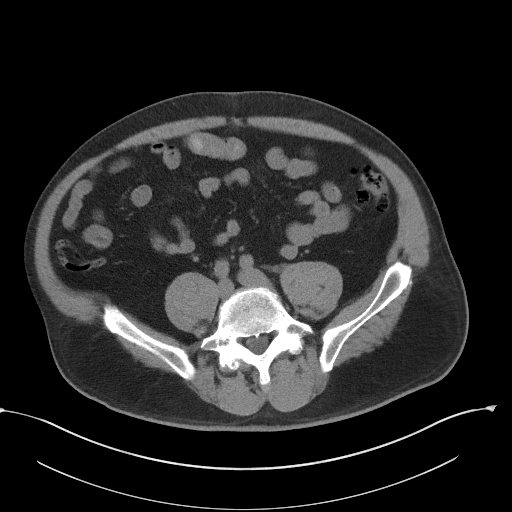
[im 52/104  soft-tissue]
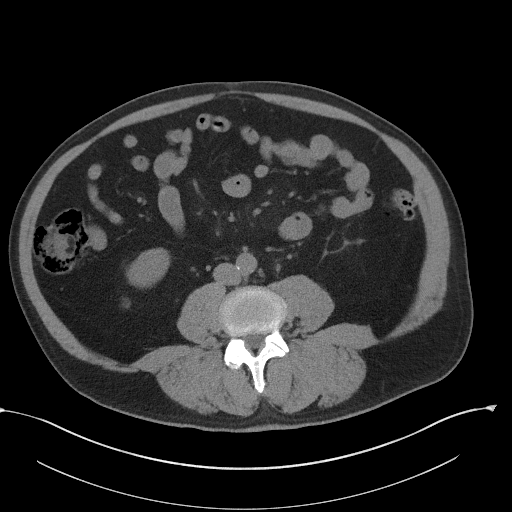
[im 61/104  soft-tissue]
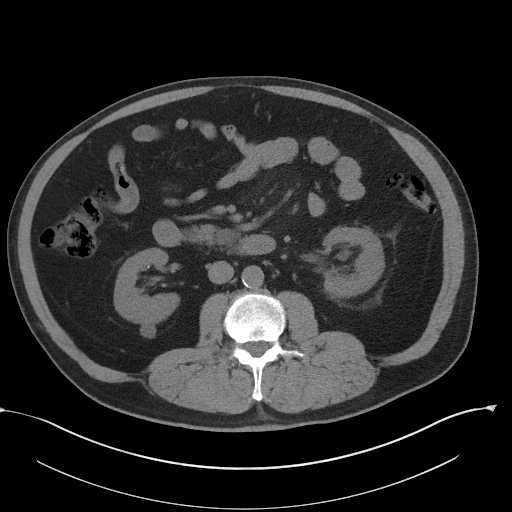
[im 69/104  soft-tissue]
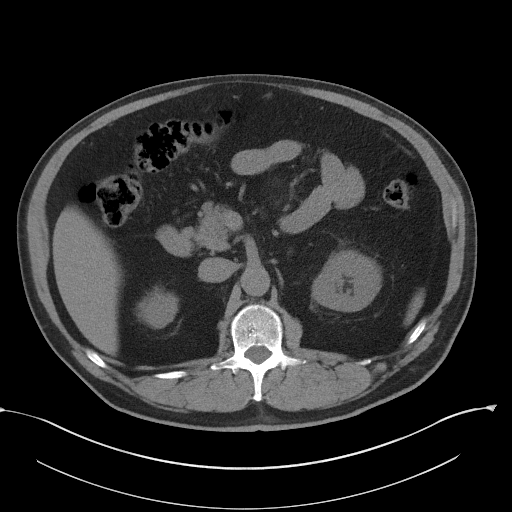
[im 69/104  bone]
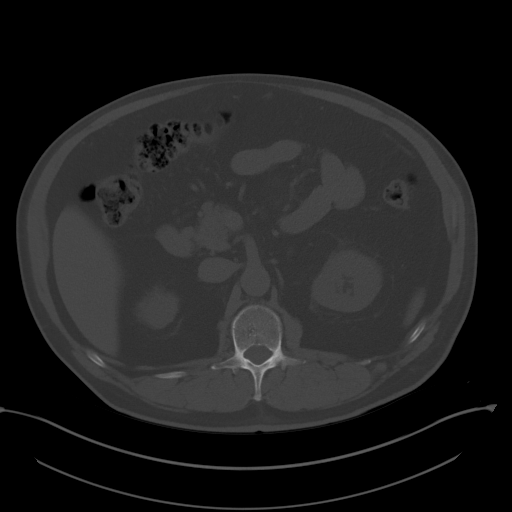
[im 73/104  soft-tissue]
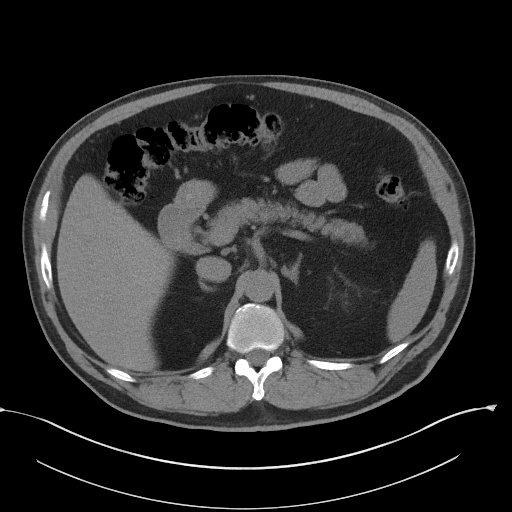
[im 82/104  soft-tissue]
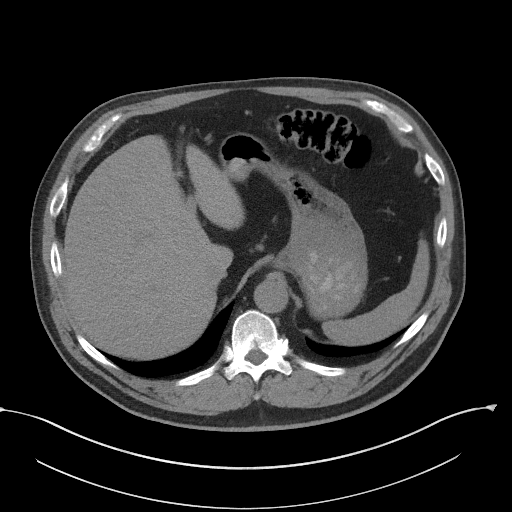
[im 91/104  soft-tissue]
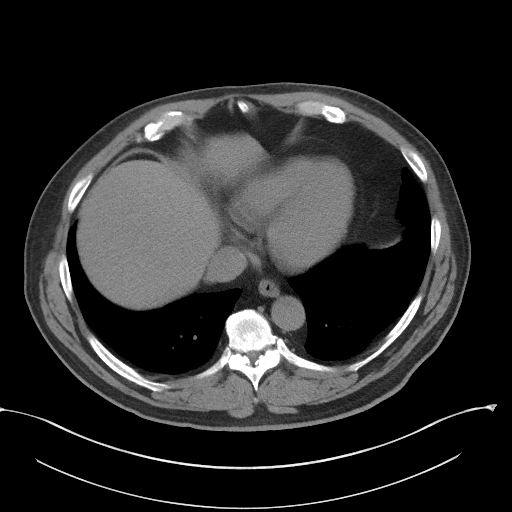
[im 99/104  soft-tissue]
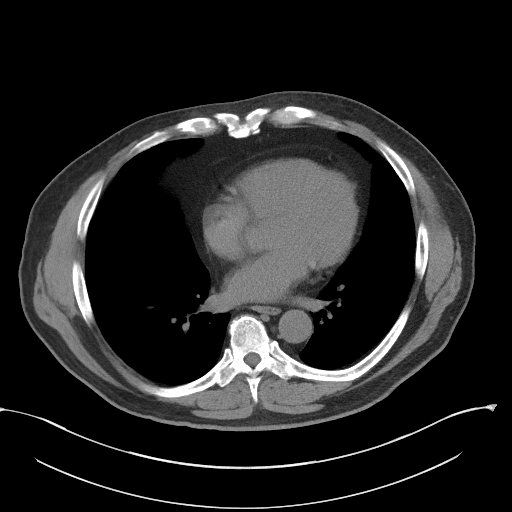

[Series 5: coronal · coronal · 0.83mm/px · 3 of 155 slices shown]
[im 52/155  soft-tissue]
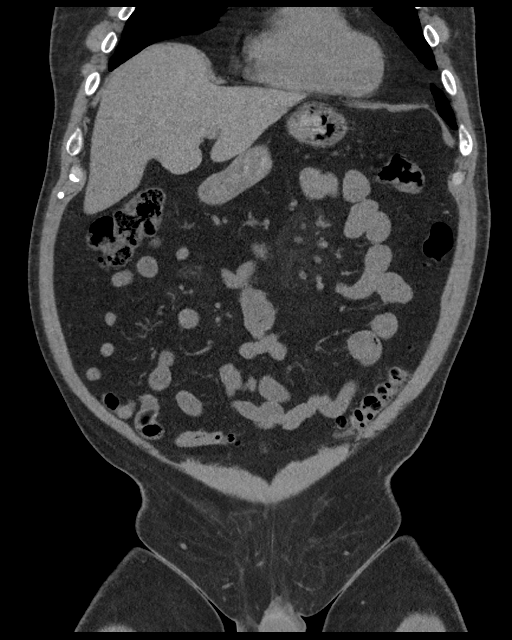
[im 69/155  soft-tissue]
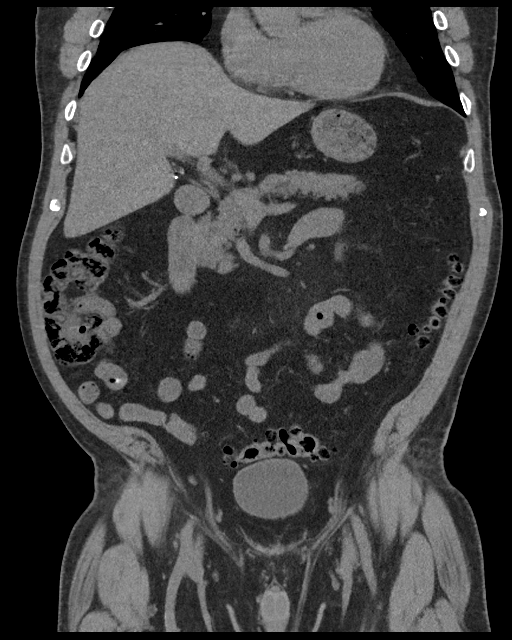
[im 86/155  soft-tissue]
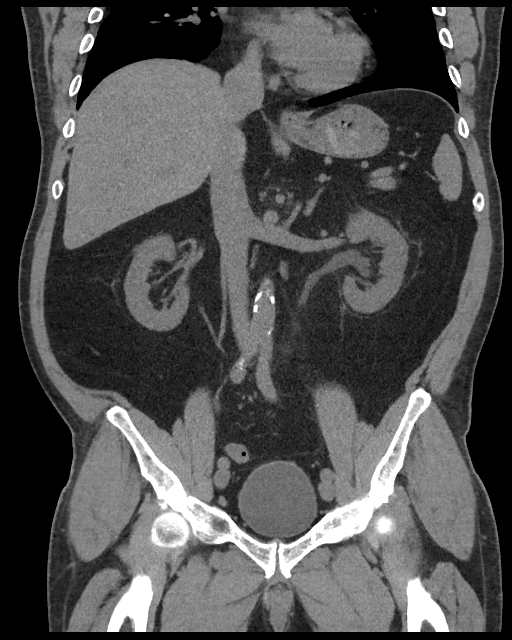

[16 of 46 positions shown; findings below may reference images not displayed]

FINDINGS: Lower chest: The lung bases are clear.

Hepatobiliary: No focal liver abnormality is seen. Status post
cholecystectomy. No biliary dilatation.

Pancreas: Unremarkable. No pancreatic ductal dilatation or
surrounding inflammatory changes.

Spleen: Normal in size without focal abnormality.

Adrenals/Urinary Tract: No adrenal gland nodules. There is a stone
in the bladder at the ureterovesical junction. Stone measures 3 mm
in diameter. There is proximal left hydronephrosis and hydroureter
with stranding around the left kidney and ureter. Multiple bilateral
intrarenal stones. Right renal collecting system and ureter are
decompressed. Bladder wall is not thickened. Multiple exophytic
cysts on the right kidney without change.

Stomach/Bowel: Stomach and small bowel are mostly decompressed.
Scattered stool throughout the colon without abnormal distention.
Diverticulosis of the sigmoid colon without evidence of
diverticulitis. The appendix is normal.

Vascular/Lymphatic: Aortic atherosclerosis. No enlarged abdominal or
pelvic lymph nodes.

Reproductive: Prostate gland is enlarged, measuring 5.1 cm diameter.
Prostate calcifications are present.

Other: No abdominal wall hernia or abnormality. No abdominopelvic
ascites.

Musculoskeletal: No acute or significant osseous findings.
IMPRESSION: 3 mm stone at the left ureterovesical junction with moderate
proximal obstruction. Multiple bilateral intrarenal nonobstructing
stones. Prostate gland is enlarged.

## 2019-04-17 IMAGING — US US EXTREM LOW*R* LIMITED
1 series · 14 of 25 positions shown · non-contrast
Comparison: No prior right knee imaging.

CLINICAL DATA: 66-year-old male with 1 week of erythema and
swelling along the right knee/patella.

EXAM:
ULTRASOUND RIGHT LOWER EXTREMITY LIMITED
TECHNIQUE: Ultrasound examination of the lower extremity soft tissues was
performed in the area of clinical concern.

[Series 1: us extrem low*right* limited · 0.08mm/px · 26 acquisitions, 14 frames shown]
[im 1/26]
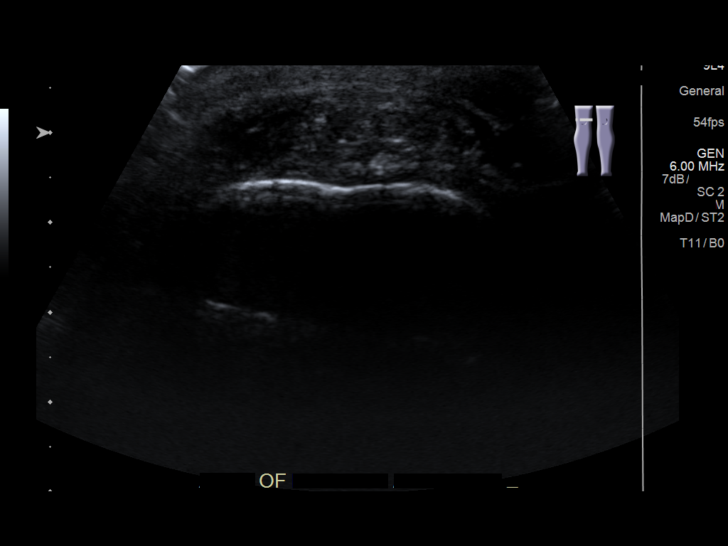
[im 3/26]
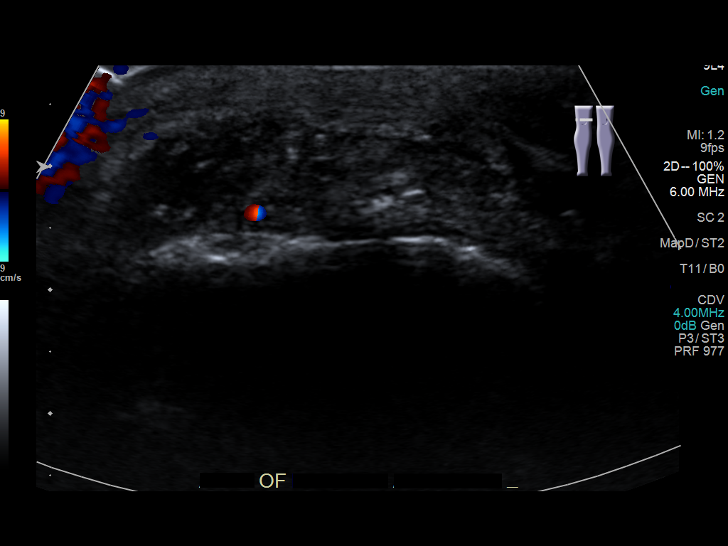
[im 5/26]
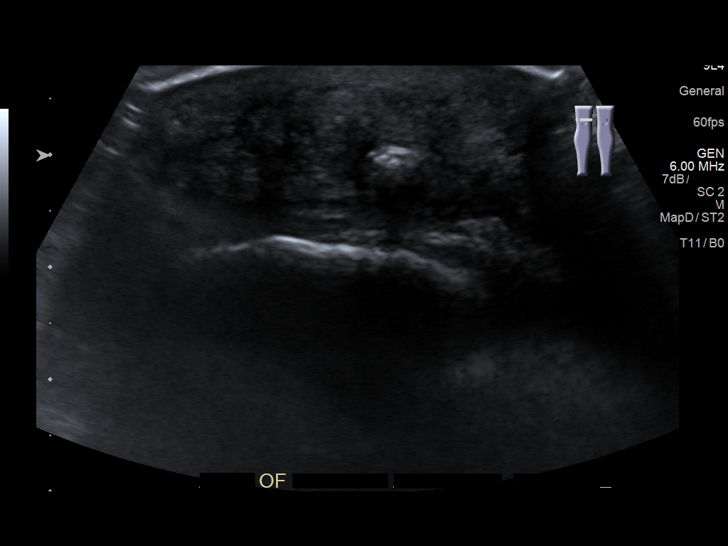
[im 7/26]
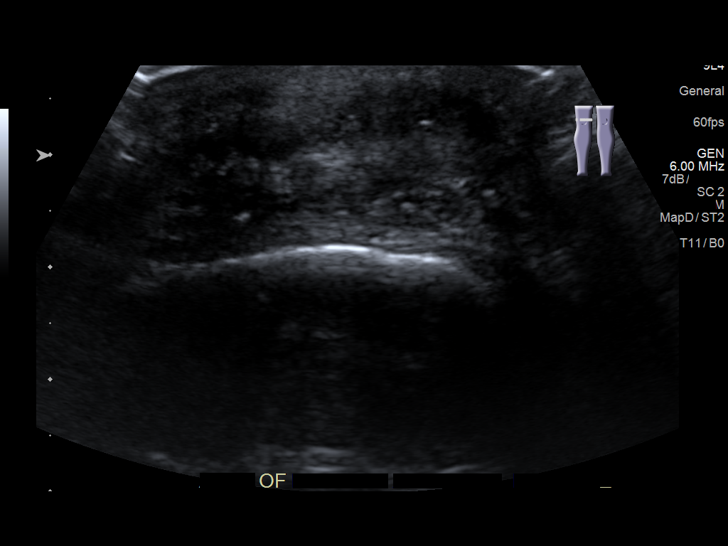
[im 9/26]
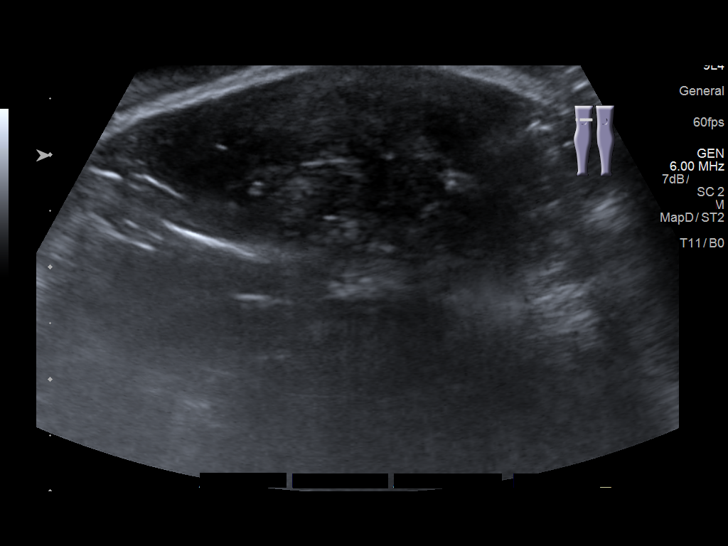
[im 10/26]
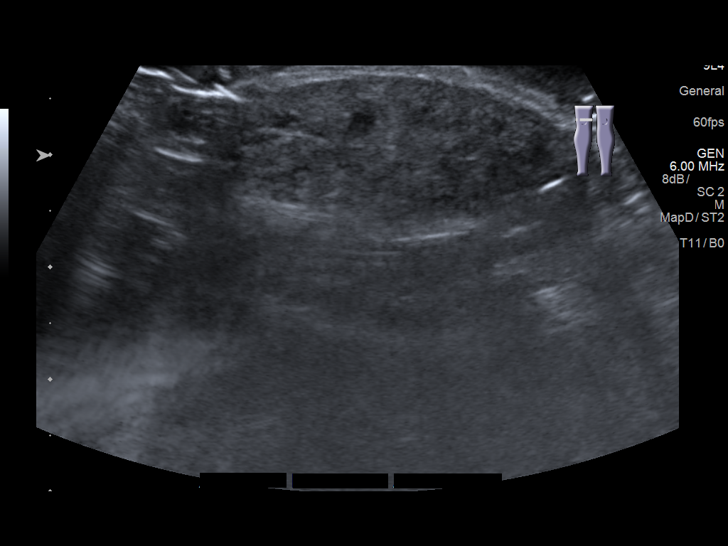
[im 12/26]
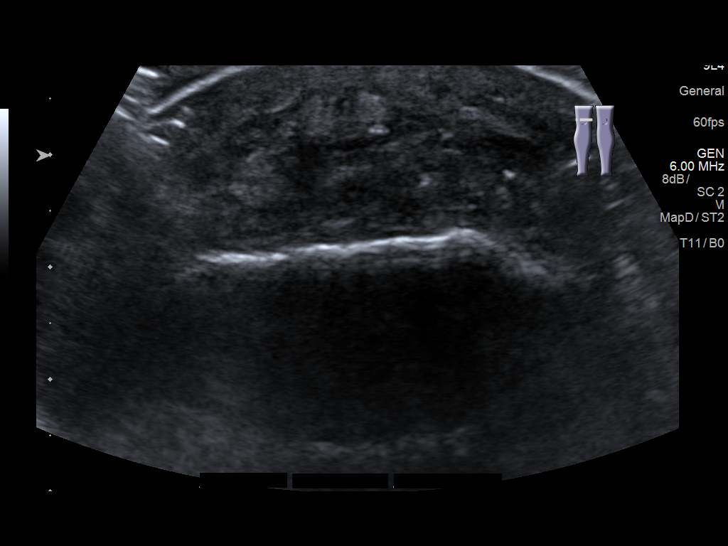
[im 14/26]
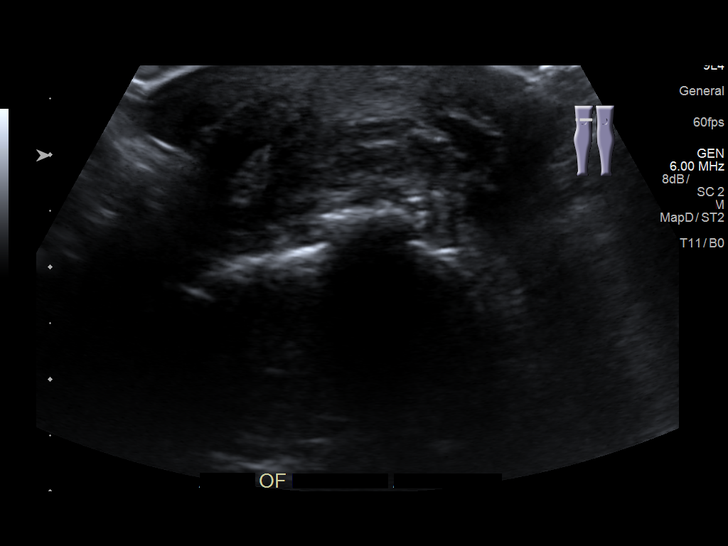
[im 16/26]
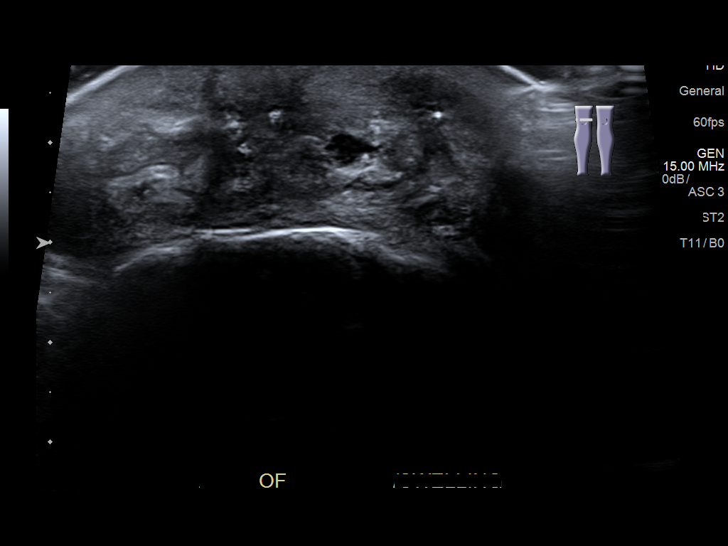
[im 17/26]
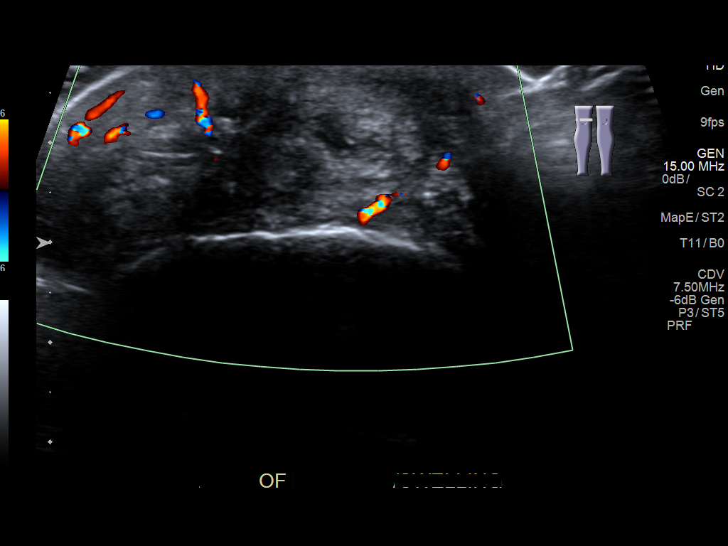
[im 19/26]
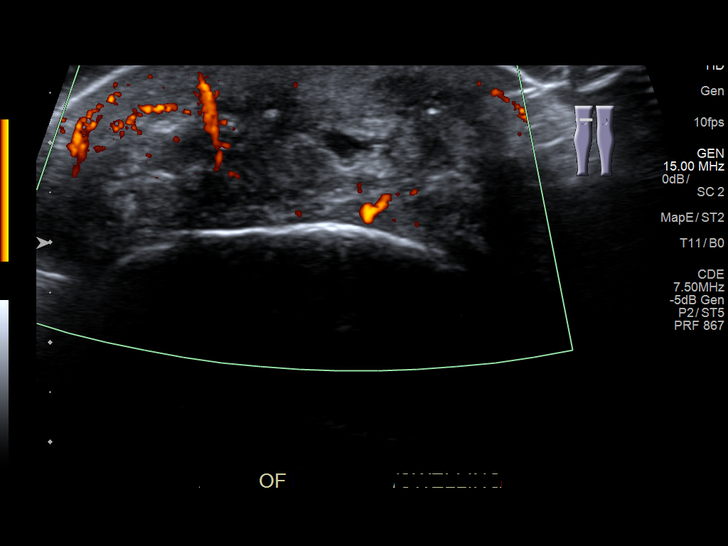
[im 21/26]
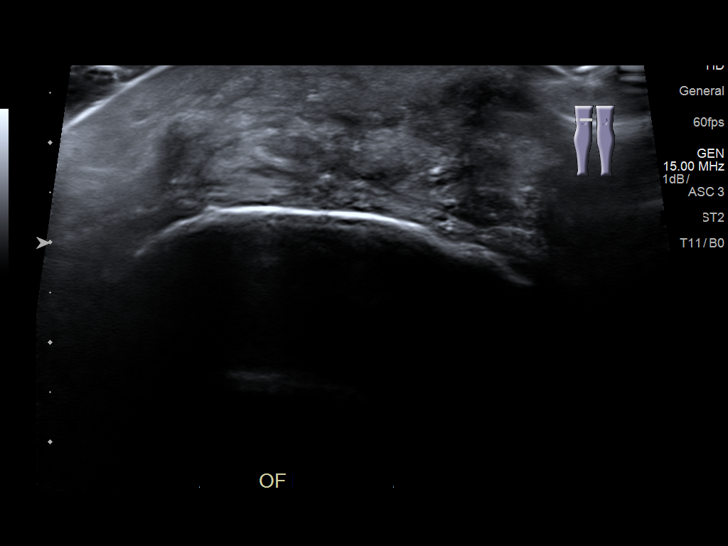
[im 23/26]
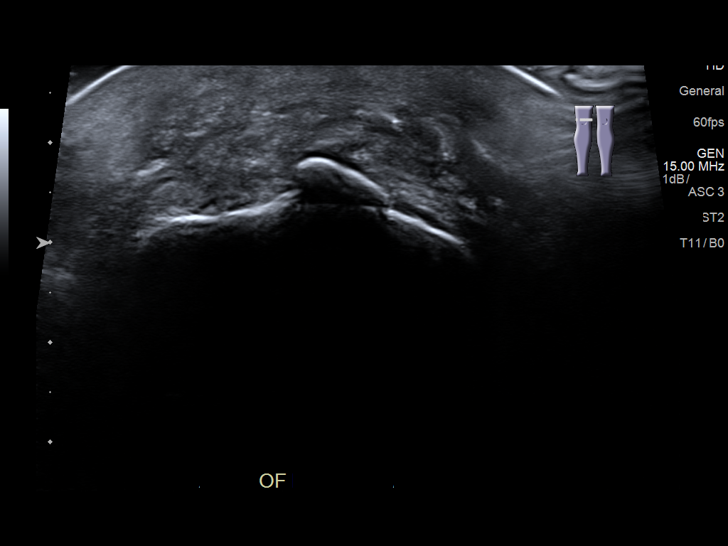
[im 26/26]
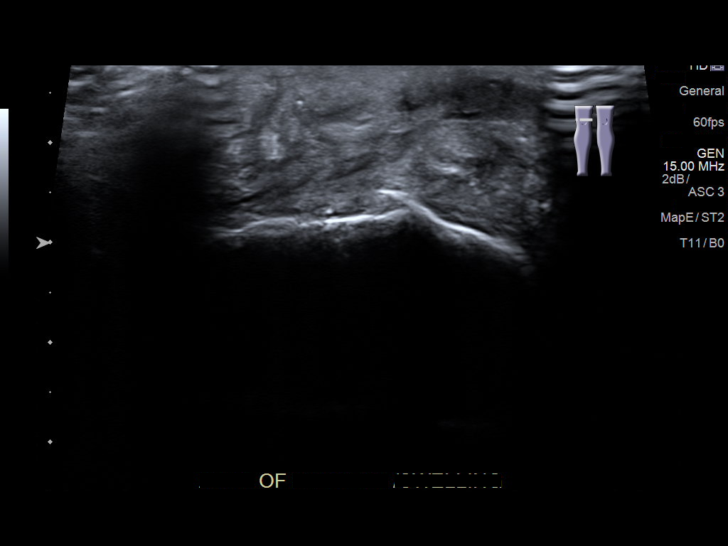

[14 of 25 positions shown; findings below may reference images not displayed]

FINDINGS: Grayscale and brief color Doppler interrogation of the soft tissues
in the area of clinical concern along the anterior right knee.
Evidence of widespread soft tissue edema, but no discrete fluid
collection or lesion is identified with ultrasound.
IMPRESSION: Generalized soft tissue edema. No discrete fluid collection/abscess.

## 2019-11-15 ENCOUNTER — Other Ambulatory Visit: Payer: Self-pay

## 2019-11-15 ENCOUNTER — Emergency Department
Admission: EM | Admit: 2019-11-15 | Discharge: 2019-11-15 | Disposition: A | Discharge status: Home / Self Care | Payer: Medicare Other | Attending: Student in an Organized Health Care Education/Training Program | Admitting: Student in an Organized Health Care Education/Training Program

## 2019-11-15 DIAGNOSIS — L02416 Cutaneous abscess of left lower limb: Secondary | ICD-10-CM | POA: Insufficient documentation

## 2019-11-15 DIAGNOSIS — Z79899 Other long term (current) drug therapy: Secondary | ICD-10-CM | POA: Insufficient documentation

## 2019-11-15 DIAGNOSIS — R222 Localized swelling, mass and lump, trunk: Secondary | ICD-10-CM | POA: Diagnosis present

## 2019-11-15 DIAGNOSIS — L0291 Cutaneous abscess, unspecified: Secondary | ICD-10-CM

## 2019-11-15 DIAGNOSIS — L0231 Cutaneous abscess of buttock: Secondary | ICD-10-CM | POA: Insufficient documentation

## 2019-11-15 MED ORDER — LIDOCAINE HCL (PF) 1 % IJ SOLN
5.0000 mL | Freq: Once | INTRAMUSCULAR | Status: AC
  Administered 2019-11-15: 5 mL via INTRADERMAL
  Filled 2019-11-15: qty 5

## 2019-11-15 MED ORDER — SULFAMETHOXAZOLE-TRIMETHOPRIM 800-160 MG PO TABS
1.0000 | ORAL_TABLET | Freq: Once | ORAL | Status: AC
  Administered 2019-11-15: 16:00:00 1 via ORAL
  Filled 2019-11-15: qty 1

## 2019-11-15 MED ORDER — CEFTRIAXONE SODIUM 1 G IJ SOLR
1.0000 g | Freq: Once | INTRAMUSCULAR | Status: AC
  Administered 2019-11-15: 17:00:00 1 g via INTRAMUSCULAR
  Filled 2019-11-15: qty 10

## 2019-11-15 MED ORDER — LIDOCAINE-EPINEPHRINE-TETRACAINE (LET) TOPICAL GEL
3.0000 mL | Freq: Once | TOPICAL | Status: AC
  Administered 2019-11-15: 16:00:00 3 mL via TOPICAL
  Filled 2019-11-15: qty 3

## 2019-11-15 MED ORDER — SULFAMETHOXAZOLE-TRIMETHOPRIM 800-160 MG PO TABS: 1.0000 | ORAL_TABLET | Freq: Two times a day (BID) | ORAL | 0 refills | Status: DC

## 2019-11-15 NOTE — ED Notes (Signed)
See triage note  Presents with possible abscess area to buttocks    States area was noticed on Sunday  Low grade fever on arrival

## 2019-11-15 NOTE — ED Provider Notes (Signed)
[facility] Emergency Department Provider Note  ____________________________________________  Time seen: Approximately 4:13 PM  I have reviewed the triage vital signs and the nursing notes.   HISTORY  Chief Complaint Abscess    HPI Phillip Galloway. is a 69 y.o. male that presents to the emergency department for evaluation of abscess to right buttocks, left thigh, and 2 abscesses to his scrotum.  He states that he is having pain to the abscess on his buttocks and his thigh.  The abscesses to his scrotum are very small, not painful and draining.  Patient states that he gets about 2 abscesses every spring.  This has been the case since he was a child.  Frequently, the abscesses will resolve on their own.  Occasionally they need to be drained. he feels well otherwise.  He sits a lot for work.  No fever, chills, nausea, vomiting.   Past Medical History:  Diagnosis Date  . Complication of anesthesia    SLIGHTLY hard to wake up  . GERD (gastroesophageal reflux disease)    occ-no meds  . History of kidney stones    h/o    There are no problems to display for this patient.   Past Surgical History:  Procedure Laterality Date  . CHOLECYSTECTOMY    . PATELLAR TENDON REPAIR Right 01/06/2018   Procedure: RIGHT KNEE PREPATELLAR BURSA EXCISION WITH ANTERIOR PATELLA SPUR EXCISION;  Surgeon: Hessie Knows, MD;  Location: [facility] ORS;  Service: Orthopedics;  Laterality: Right;  . WRIST SURGERY Left     Prior to Admission medications   Medication Sig Start Date End Date Taking? Authorizing Provider  MAGNESIUM PO Take 1 tablet by mouth daily.    [provider]  naproxen sodium (ALEVE) 220 MG tablet Take 660 mg by mouth 2 (two) times daily as needed (for pain.).    [provider]  sulfamethoxazole-trimethoprim (BACTRIM DS) 800-160 MG tablet Take 1 tablet by mouth 2 (two) times daily. 11/15/19   Laban Emperor, PA-C    Allergies Codeine and Darvon  [propoxyphene]  No family history on file.  Social History Social History   Tobacco Use  . Smoking status: Never Smoker  . Smokeless tobacco: Never Used  Substance Use Topics  . Alcohol use: No  . Drug use: Never     Review of Systems  Constitutional: No fever/chills ENT: No upper respiratory complaints. Cardiovascular: No chest pain. Respiratory: No SOB. Gastrointestinal: No abdominal pain.  No nausea, no vomiting.  Musculoskeletal: Negative for musculoskeletal pain. Skin: Negative for abrasions, lacerations, ecchymosis. Positive for abscess. Neurological: Negative for headaches, numbness or tingling   ____________________________________________   PHYSICAL EXAM:  VITAL SIGNS: ED Triage Vitals  Enc Vitals Group     BP 11/15/19 1447 137/76     Pulse Rate 11/15/19 1447 68     Resp 11/15/19 1447 18     Temp 11/15/19 1447 99.2 F (37.3 C)     Temp src --      SpO2 11/15/19 1447 100 %     Weight 11/15/19 1449 208 lb (94.3 kg)     Height 11/15/19 1449 5' 9.5" (1.765 m)     Head Circumference --      Peak Flow --      Pain Score 11/15/19 1448 7     Pain Loc --      Pain Edu? --      Excl. in [place]? --      Constitutional: Alert and oriented. Well appearing  and in no acute distress. Eyes: Conjunctivae are normal. PERRL. EOMI. Head: Atraumatic. ENT:      Ears:      Nose: No congestion/rhinnorhea.      Mouth/Throat: Mucous membranes are moist.  Neck: No stridor. Cardiovascular: Normal rate, regular rhythm.  Good peripheral circulation. Respiratory: Normal respiratory effort without tachypnea or retractions. Lungs CTAB. Good air entry to the bases with no decreased or absent breath sounds. Musculoskeletal: Full range of motion to all extremities. No gross deformities appreciated. Neurologic:  Normal speech and language. No gross focal neurologic deficits are appreciated.  Skin:  Skin is warm, dry and intact.  1 cm x 1 cm area of erythema with central induration to  right mid buttocks.  1 cm x 1 cm area of erythema with central induration to left proximal thigh next to left buttocks.  1/2 cm x 1/2 cm area of erythema that is actively draining to left testicle.  1/2 cm 1/2 cm area of erythema that is actively draining to right testicle. Psychiatric: Mood and affect are normal. Speech and behavior are normal. Patient exhibits appropriate insight and judgement.   ____________________________________________   LABS (all labs ordered are listed, but only abnormal results are displayed)  Labs Reviewed - No data to display ____________________________________________  EKG   ____________________________________________  RADIOLOGY   No results found.  ____________________________________________    PROCEDURES  Procedure(s) performed:    Procedures  INCISION AND DRAINAGE Performed by: Laban Emperor Consent: Verbal consent obtained. Risks and benefits: risks, benefits and alternatives were discussed Type: abscess  Body area: left buttocks  Anesthesia: local infiltration  Incision was made with a scalpel.  Local anesthetic: lidocaine 1 % without epinephrine  Anesthetic total: 2 ml  Complexity: complex Blunt dissection to break up loculations  Drainage: purulent  Drainage amount: mild  Packing material: 1/4 in iodoform gauze  Patient tolerance: Patient tolerated the procedure well with no immediate complications.  INCISION AND DRAINAGE Performed by: Laban Emperor Consent: Verbal consent obtained. Risks and benefits: risks, benefits and alternatives were discussed Type: abscess  Body area: left thigh  Anesthesia: local infiltration  Incision was made with a scalpel.  Local anesthetic: lidocaine 1 % without epinephrine  Anesthetic total: 2 ml  Complexity: complex Blunt dissection to break up loculations  Drainage: purulent  Drainage amount: mild  Packing material: 1/4 in iodoform gauze  Patient tolerance:  Patient tolerated the procedure well with no immediate complications.    Medications  lidocaine-EPINEPHrine-tetracaine (LET) topical gel (3 mLs Topical Given 11/15/19 1541)  lidocaine (PF) (XYLOCAINE) 1 % injection 5 mL (5 mLs Intradermal Given 11/15/19 1541)  sulfamethoxazole-trimethoprim (BACTRIM DS) 800-160 MG per tablet 1 tablet (1 tablet Oral Given 11/15/19 1541)  cefTRIAXone (ROCEPHIN) injection 1 g (1 g Intramuscular Given 11/15/19 1659)     ____________________________________________   INITIAL IMPRESSION / ASSESSMENT AND PLAN / ED COURSE  Pertinent labs & imaging results that were available during my care of the patient were reviewed by me and considered in my medical decision making (see chart for details).  Review of the [place] CSRS was performed in accordance of the [place] prior to dispensing any controlled drugs.     Patient presented to the emergency department for evaluation of multiple small abscesses.  Vital signs and exam are reassuring.  Abscess to buttocks and thigh were drained in the emergency department.  Patient has 2 abscesses to his scrotum that are very small and draining.  Patient denies any systemic symptoms.  He feels  well otherwise.  He does have a history of the is in the spring.  Patient will be discharged home with prescriptions for Bactrim. Patient is to follow up with primary care as directed. Patient is given ED precautions to return to the ED for any worsening or new symptoms.   Phillip TELLADO Sr. was evaluated in Emergency Department on 11/15/2019 for the symptoms described in the history of present illness. He was evaluated in the context of the global COVID-19 pandemic, which necessitated consideration that the patient might be at risk for infection with the SARS-CoV-2 virus that causes COVID-19. Institutional protocols and algorithms that pertain to the evaluation of patients at risk for COVID-19 are in a state of rapid change based on information released by  regulatory bodies including the CDC and federal and state organizations. These policies and algorithms were followed during the patient's care in the ED.  ____________________________________________  FINAL CLINICAL IMPRESSION(S) / ED DIAGNOSES  Final diagnoses:  Abscess      NEW MEDICATIONS STARTED DURING THIS VISIT:  ED Discharge Orders         Ordered    sulfamethoxazole-trimethoprim (BACTRIM DS) 800-160 MG tablet  2 times daily     11/15/19 1735              This chart was dictated using voice recognition software/Dragon. Despite best efforts to proofread, errors can occur which can change the meaning. Any change was purely unintentional.    Laban Emperor, PA-C 11/15/19 2344    Merlyn Lot, MD 11/16/19 0001

## 2019-11-15 NOTE — ED Triage Notes (Signed)
Pt comes via POV from home with c/o abscess noted to bottom. Pt states he has a few of them the patient states pain when sitting. Pt states he noticed them on Saturday.

## 2019-12-06 ENCOUNTER — Other Ambulatory Visit: Payer: Self-pay

## 2019-12-06 ENCOUNTER — Ambulatory Visit (INDEPENDENT_AMBULATORY_CARE_PROVIDER_SITE_OTHER): Payer: Medicare Other | Admitting: Dermatology

## 2019-12-06 DIAGNOSIS — L821 Other seborrheic keratosis: Secondary | ICD-10-CM | POA: Diagnosis not present

## 2019-12-06 DIAGNOSIS — D170 Benign lipomatous neoplasm of skin and subcutaneous tissue of head, face and neck: Secondary | ICD-10-CM

## 2019-12-06 DIAGNOSIS — Z1283 Encounter for screening for malignant neoplasm of skin: Secondary | ICD-10-CM

## 2019-12-06 DIAGNOSIS — L918 Other hypertrophic disorders of the skin: Secondary | ICD-10-CM

## 2019-12-06 DIAGNOSIS — D1800 Hemangioma unspecified site: Secondary | ICD-10-CM

## 2019-12-06 NOTE — Progress Notes (Signed)
   New Patient Visit  Subjective  Phillip Galloway. is a 69 y.o. male who presents for the following: Other (Bump of right temple x ~20 years) and Skin Tag (bilateral axilla). An upper body skin exam was performed for skin cancer screening and mole check.   The following portions of the chart were reviewed this encounter and updated as appropriate:  Tobacco  Allergies  Meds  Problems  Med Hx  Surg Hx  Fam Hx      Review of Systems:  No other skin or systemic complaints except as noted in HPI or Assessment and Plan.  Objective  Well appearing patient in no apparent distress; mood and affect are within normal limits.  All skin waist up examined.  Objective  Left bicep, Right bicep: Red papules.   Objective  Right Temporal Scalp: 1.2 cm rubbery nodule  Objective  Head - Anterior (Face): Stuck-on, waxy, tan-brown papules and plaques -- Discussed benign etiology and prognosis.   Objective  Bilateral axilla: Fleshy, skin-colored pedunculated papules.     Assessment & Plan  Hemangioma, unspecified site (2) Left bicep; Right bicep  Benign, observe.    Lipoma of face Right Temporal Scalp  Benign. Discussed excision. Patient may consider in the future.  Seborrheic keratosis Head - Anterior (Face)  Benign, observe.    Skin cancer screening  Skin tag Bilateral axilla  Discussed snip excision. Patient may consider in the future. Patient asked about tying them and letting them fall off. Advised patient that was ok but may take a long time to fall off.  Return if symptoms worsen or fail to improve.  I, Ashok Cordia, CMA, am acting as scribe for Sarina Ser, MD . Documentation: I have reviewed the above documentation for accuracy and completeness, and I agree with the above.  Sarina Ser, MD

## 2019-12-10 ENCOUNTER — Encounter: Payer: Self-pay | Admitting: Dermatology

## 2020-09-03 ENCOUNTER — Other Ambulatory Visit: Payer: Self-pay

## 2020-09-03 ENCOUNTER — Encounter: Payer: Self-pay | Admitting: Ophthalmology

## 2020-09-06 ENCOUNTER — Other Ambulatory Visit
Admission: RE | Admit: 2020-09-06 | Discharge: 2020-09-06 | Disposition: A | Discharge status: Home / Self Care | Payer: Medicare Other | Source: Ambulatory Visit | Attending: Ophthalmology | Admitting: Ophthalmology

## 2020-09-06 ENCOUNTER — Other Ambulatory Visit: Payer: Self-pay

## 2020-09-06 DIAGNOSIS — Z01812 Encounter for preprocedural laboratory examination: Secondary | ICD-10-CM | POA: Diagnosis present

## 2020-09-06 DIAGNOSIS — Z20822 Contact with and (suspected) exposure to covid-19: Secondary | ICD-10-CM | POA: Diagnosis not present

## 2020-09-06 LAB — SARS CORONAVIRUS 2 (TAT 6-24 HRS): SARS Coronavirus 2: NEGATIVE

## 2020-09-06 NOTE — Discharge Instructions (Signed)

## 2020-09-10 ENCOUNTER — Other Ambulatory Visit: Payer: Self-pay

## 2020-09-10 ENCOUNTER — Encounter: Payer: Self-pay | Admitting: Ophthalmology

## 2020-09-10 ENCOUNTER — Ambulatory Visit: Payer: Medicare Other | Admitting: Anesthesiology

## 2020-09-10 ENCOUNTER — Encounter
Admission: RE | Disposition: A | Discharge status: Home / Self Care | Payer: Self-pay | Source: Home / Self Care | Attending: Ophthalmology

## 2020-09-10 ENCOUNTER — Ambulatory Visit
Admission: RE | Admit: 2020-09-10 | Discharge: 2020-09-10 | Disposition: A | Discharge status: Home / Self Care | Payer: Medicare Other | Attending: Ophthalmology | Admitting: Ophthalmology

## 2020-09-10 DIAGNOSIS — Z7984 Long term (current) use of oral hypoglycemic drugs: Secondary | ICD-10-CM | POA: Insufficient documentation

## 2020-09-10 DIAGNOSIS — E1136 Type 2 diabetes mellitus with diabetic cataract: Secondary | ICD-10-CM | POA: Insufficient documentation

## 2020-09-10 DIAGNOSIS — Z885 Allergy status to narcotic agent status: Secondary | ICD-10-CM | POA: Insufficient documentation

## 2020-09-10 DIAGNOSIS — Z79899 Other long term (current) drug therapy: Secondary | ICD-10-CM | POA: Insufficient documentation

## 2020-09-10 DIAGNOSIS — H2512 Age-related nuclear cataract, left eye: Secondary | ICD-10-CM | POA: Diagnosis not present

## 2020-09-10 DIAGNOSIS — Z7982 Long term (current) use of aspirin: Secondary | ICD-10-CM | POA: Insufficient documentation

## 2020-09-10 HISTORY — PX: CATARACT EXTRACTION W/PHACO: SHX586

## 2020-09-10 HISTORY — DX: Type 2 diabetes mellitus without complications: E11.9

## 2020-09-10 HISTORY — DX: Essential (primary) hypertension: I10

## 2020-09-10 LAB — GLUCOSE, CAPILLARY
Glucose-Capillary: 160 mg/dL — ABNORMAL HIGH (ref 70–99)
Glucose-Capillary: 183 mg/dL — ABNORMAL HIGH (ref 70–99)

## 2020-09-10 SURGERY — PHACOEMULSIFICATION, CATARACT, WITH IOL INSERTION
Anesthesia: Monitor Anesthesia Care | Site: Eye | Laterality: Left

## 2020-09-10 MED ORDER — FENTANYL CITRATE (PF) 100 MCG/2ML IJ SOLN
INTRAMUSCULAR | Status: DC | PRN
  Administered 2020-09-10: 50 ug via INTRAVENOUS

## 2020-09-10 MED ORDER — NA CHONDROIT SULF-NA HYALURON 40-17 MG/ML IO SOLN
INTRAOCULAR | Status: DC | PRN
  Administered 2020-09-10: 1 mL via INTRAOCULAR

## 2020-09-10 MED ORDER — LIDOCAINE HCL (PF) 2 % IJ SOLN
INTRAOCULAR | Status: DC | PRN
  Administered 2020-09-10: 1 mL

## 2020-09-10 MED ORDER — EPINEPHRINE PF 1 MG/ML IJ SOLN
INTRAOCULAR | Status: DC | PRN
  Administered 2020-09-10: 77 mL via OPHTHALMIC

## 2020-09-10 MED ORDER — MOXIFLOXACIN HCL 0.5 % OP SOLN
OPHTHALMIC | Status: DC | PRN
  Administered 2020-09-10: 0.2 mL via OPHTHALMIC

## 2020-09-10 MED ORDER — MIDAZOLAM HCL 2 MG/2ML IJ SOLN
INTRAMUSCULAR | Status: DC | PRN
  Administered 2020-09-10: 1 mg via INTRAVENOUS

## 2020-09-10 MED ORDER — TETRACAINE HCL 0.5 % OP SOLN
1.0000 [drp] | OPHTHALMIC | Status: DC | PRN
  Administered 2020-09-10 (×3): 1 [drp] via OPHTHALMIC

## 2020-09-10 MED ORDER — ARMC OPHTHALMIC DILATING DROPS
1.0000 | OPHTHALMIC | Status: DC | PRN
Start: 2020-09-10 — End: 2020-09-10
  Administered 2020-09-10 (×3): 1 via OPHTHALMIC

## 2020-09-10 MED ORDER — LACTATED RINGERS IV SOLN: INTRAVENOUS | Status: DC

## 2020-09-10 MED ORDER — BRIMONIDINE TARTRATE-TIMOLOL 0.2-0.5 % OP SOLN
OPHTHALMIC | Status: DC | PRN
  Administered 2020-09-10: 1 [drp] via OPHTHALMIC

## 2020-09-10 SURGICAL SUPPLY — 19 items
CANNULA ANT/CHMB 27G (MISCELLANEOUS) ×2 IMPLANT
CANNULA ANT/CHMB 27GA (MISCELLANEOUS) ×4 IMPLANT
GLOVE SURG LX 8.0 MICRO (GLOVE) ×1
GLOVE SURG LX STRL 8.0 MICRO (GLOVE) ×1 IMPLANT
GLOVE SURG TRIUMPH 8.0 PF LTX (GLOVE) ×2 IMPLANT
GOWN STRL REUS W/ TWL LRG LVL3 (GOWN DISPOSABLE) ×2 IMPLANT
GOWN STRL REUS W/TWL LRG LVL3 (GOWN DISPOSABLE) ×4
LENS IOL TECNIS EYHANCE 17.0 (Intraocular Lens) ×1 IMPLANT
MARKER SKIN DUAL TIP RULER LAB (MISCELLANEOUS) ×2 IMPLANT
NDL FILTER BLUNT 18X1 1/2 (NEEDLE) ×1 IMPLANT
NEEDLE FILTER BLUNT 18X 1/2SAF (NEEDLE) ×1
NEEDLE FILTER BLUNT 18X1 1/2 (NEEDLE) ×1 IMPLANT
PACK EYE AFTER SURG (MISCELLANEOUS) ×2 IMPLANT
PACK OPTHALMIC (MISCELLANEOUS) ×2 IMPLANT
PACK PORFILIO (MISCELLANEOUS) ×2 IMPLANT
SYR 3ML LL SCALE MARK (SYRINGE) ×2 IMPLANT
SYR TB 1ML LUER SLIP (SYRINGE) ×2 IMPLANT
WATER STERILE IRR 250ML POUR (IV SOLUTION) ×2 IMPLANT
WIPE NON LINTING 3.25X3.25 (MISCELLANEOUS) ×2 IMPLANT

## 2020-09-10 NOTE — Op Note (Signed)
PREOPERATIVE DIAGNOSIS:  Nuclear sclerotic cataract of the left eye.   POSTOPERATIVE DIAGNOSIS:  Nuclear sclerotic cataract of the left eye.   OPERATIVE PROCEDURE:@   SURGEON:  Birder Robson, MD.   ANESTHESIA:  Anesthesiologist: Ardeth Sportsman, MD CRNA: Cameron Ali, CRNA  1.      Managed anesthesia care. 2.     0.3ml of Shugarcaine was instilled following the paracentesis   COMPLICATIONS:  None.   TECHNIQUE:   Stop and chop   DESCRIPTION OF PROCEDURE:  The patient was examined and consented in the preoperative holding area where the aforementioned topical anesthesia was applied to the left eye and then brought back to the Operating Room where the left eye was prepped and draped in the usual sterile ophthalmic fashion and a lid speculum was placed. A paracentesis was created with the side port blade and the anterior chamber was filled with viscoelastic. A near clear corneal incision was performed with the steel keratome. A continuous curvilinear capsulorrhexis was performed with a cystotome followed by the capsulorrhexis forceps. Hydrodissection and hydrodelineation were carried out with BSS on a blunt cannula. The lens was removed in a stop and chop  technique and the remaining cortical material was removed with the irrigation-aspiration handpiece. The capsular bag was inflated with viscoelastic and the Technis [id number] lens was placed in the capsular bag without complication. The remaining viscoelastic was removed from the eye with the irrigation-aspiration handpiece. The wounds were hydrated. The anterior chamber was flushed with BSS and the eye was inflated to physiologic pressure. 0.35ml Vigamox was placed in the anterior chamber. The wounds were found to be water tight. The eye was dressed with Combigan. The patient was given protective glasses to wear throughout the day and a shield with which to sleep tonight. The patient was also given drops with which to begin a drop regimen today and will  follow-up with me in one day. Implant Name Type Inv. Item Serial No. Manufacturer Lot No. LRB No. Used Action  LENS IOL TECNIS EYHANCE 17.0 - [id number] Intraocular Lens LENS IOL TECNIS EYHANCE 17.0 [id number] JOHNSON   Left 1 Implanted    Procedure(s) with comments: CATARACT EXTRACTION PHACO AND INTRAOCULAR LENS PLACEMENT (IOC) LEFT DIABETIC 4.20 00:32.2 (Left) - Diabetic - oral meds  Electronically signed: Birder Robson 09/10/2020 8:18 AM

## 2020-09-10 NOTE — Anesthesia Preprocedure Evaluation (Signed)
Anesthesia Evaluation  Patient identified by MRN, date of birth, ID band Patient awake    Reviewed: Allergy & Precautions, NPO status , Patient's Chart, lab work & pertinent test results  Airway Mallampati: II  TM Distance: >3 FB Neck ROM: Full    Dental no notable dental hx.    Pulmonary neg pulmonary ROS,    Pulmonary exam normal        Cardiovascular hypertension, Normal cardiovascular exam     Neuro/Psych negative neurological ROS  negative psych ROS   GI/Hepatic GERD  Controlled,  Endo/Other  diabetes, Type 2BMI 30  Renal/GU      Musculoskeletal negative musculoskeletal ROS (+)   Abdominal (+) + obese,   Peds  Hematology negative hematology ROS (+)   Anesthesia Other Findings   Reproductive/Obstetrics                             Anesthesia Physical Anesthesia Plan  ASA: III  Anesthesia Plan: MAC   Post-op Pain Management:    Induction: Intravenous  PONV Risk Score and Plan: 1 and TIVA, Midazolam and Treatment may vary due to age or medical condition  Airway Management Planned: Natural Airway and Nasal Cannula  Additional Equipment: None  Intra-op Plan:   Post-operative Plan:   Informed Consent: I have reviewed the patients History and Physical, chart, labs and discussed the procedure including the risks, benefits and alternatives for the proposed anesthesia with the patient or authorized representative who has indicated his/her understanding and acceptance.     Dental advisory given  Plan Discussed with: CRNA  Anesthesia Plan Comments:         Anesthesia Quick Evaluation

## 2020-09-10 NOTE — Anesthesia Procedure Notes (Signed)
Procedure Name: MAC Date/Time: 09/10/2020 7:58 AM Performed by: Cameron Ali, CRNA Pre-anesthesia Checklist: Patient identified, Emergency Drugs available, Suction available, Timeout performed and Patient being monitored Patient Re-evaluated:Patient Re-evaluated prior to induction Oxygen Delivery Method: Nasal cannula Placement Confirmation: positive ETCO2

## 2020-09-10 NOTE — Anesthesia Postprocedure Evaluation (Signed)
Anesthesia Post Note  Patient: Phillip DELAUGHTER Sr.  Procedure(s) Performed: CATARACT EXTRACTION PHACO AND INTRAOCULAR LENS PLACEMENT (IOC) LEFT DIABETIC 4.20 00:32.2 (Left Eye)     Patient location during evaluation: PACU Anesthesia Type: MAC Level of consciousness: awake and alert Pain management: pain level controlled Vital Signs Assessment: post-procedure vital signs reviewed and stable Respiratory status: nonlabored ventilation and spontaneous breathing Cardiovascular status: blood pressure returned to baseline Postop Assessment: no apparent nausea or vomiting Anesthetic complications: no   No complications documented.  Jazleen Robeck [employer]

## 2020-09-10 NOTE — H&P (Signed)
[place]   Primary Care Physician:  [facility] Ophthalmologist: Dr. George Ina  Pre-Procedure History & Physical: HPI:  Phillip Palma. is a 70 y.o. male here for cataract surgery.   Past Medical History:  Diagnosis Date  . Complication of anesthesia    SLIGHTLY hard to wake up  . Diabetes mellitus, type 2 ([place])   . GERD (gastroesophageal reflux disease)    occ-no meds  . History of kidney stones    h/o  . Hypertension     Past Surgical History:  Procedure Laterality Date  . CHOLECYSTECTOMY    . PATELLAR TENDON REPAIR Right 01/06/2018   Procedure: RIGHT KNEE PREPATELLAR BURSA EXCISION WITH ANTERIOR PATELLA SPUR EXCISION;  Surgeon: Hessie Knows, MD;  Location: [facility] ORS;  Service: Orthopedics;  Laterality: Right;  . WRIST SURGERY Left     Prior to Admission medications   Medication Sig Start Date End Date Taking? Authorizing Provider  ASPIRIN 81 PO Take by mouth daily.   Yes [provider]  atorvastatin (LIPITOR) 40 MG tablet Take 40 mg by mouth at bedtime.   Yes [provider]  losartan (COZAAR) 50 MG tablet Take 50 mg by mouth daily.   Yes [provider]  metFORMIN (GLUCOPHAGE) 500 MG tablet Take 500 mg by mouth 2 (two) times daily with a meal.   Yes [provider]  naproxen sodium (ALEVE) 220 MG tablet Take 660 mg by mouth 2 (two) times daily as needed (for pain.).   Yes [provider]  Omega-3 Fatty Acids (FISH OIL) 1000 MG CAPS Take by mouth daily.   Yes [provider]  Probiotic Product (PROBIOTIC DAILY PO) Take by mouth daily.   Yes [provider]  tadalafil (CIALIS) 10 MG tablet Take 10 mg by mouth daily as needed for erectile dysfunction.   Yes [provider]  Turmeric 450 MG CAPS Take by mouth daily.   Yes [provider]  vitamin B-12 (CYANOCOBALAMIN) 500 MCG tablet Take 500 mcg by mouth daily.   Yes [provider]    Allergies as of  07/22/2020 - Review Complete 12/10/2019  Allergen Reaction Noted  . Codeine Shortness Of Breath 06/29/2017  . Darvon [propoxyphene] Shortness Of Breath 09/02/2016    History reviewed. No pertinent family history.  Social History   Socioeconomic History  . Marital status: Single    Spouse name: Not on file  . Number of children: Not on file  . Years of education: Not on file  . Highest education level: Not on file  Occupational History  . Not on file  Tobacco Use  . Smoking status: Never Smoker  . Smokeless tobacco: Never Used  Vaping Use  . Vaping Use: Never used  Substance and Sexual Activity  . Alcohol use: No  . Drug use: Never  . Sexual activity: Not on file  Other Topics Concern  . Not on file  Social History Narrative  . Not on file   Social Determinants of Health   Financial Resource Strain: Not on file  Food Insecurity: Not on file  Transportation Needs: Not on file  Physical Activity: Not on file  Stress: Not on file  Social Connections: Not on file  Intimate Partner Violence: Not on file    Review of Systems: See HPI, otherwise negative ROS  Physical Exam: BP (!) 144/87   Pulse 65   Temp 97.8 F (36.6 C) (Temporal)   Ht 5\' 9"  (1.753 m)  Wt 93.4 kg   SpO2 97%   BMI 30.42 kg/m  General:   Alert,  pleasant and cooperative in NAD Head:  Normocephalic and atraumatic. Respiratory:  Normal work of breathing.  Impression/Plan: Phillip Buzzard Sr. is here for cataract surgery.  Risks, benefits, limitations, and alternatives regarding cataract surgery have been reviewed with the patient.  Questions have been answered.  All parties agreeable.   Birder Robson, MD  09/10/2020, 7:51 AM

## 2020-09-10 NOTE — Transfer of Care (Signed)
Immediate Anesthesia Transfer of Care Note  Patient: Phillip DOOLING Sr.  Procedure(s) Performed: CATARACT EXTRACTION PHACO AND INTRAOCULAR LENS PLACEMENT (IOC) LEFT DIABETIC 4.20 00:32.2 (Left Eye)  Patient Location: PACU  Anesthesia Type: MAC  Level of Consciousness: awake, alert  and patient cooperative  Airway and Oxygen Therapy: Patient Spontanous Breathing and Patient connected to supplemental oxygen  Post-op Assessment: Post-op Vital signs reviewed, Patient's Cardiovascular Status Stable, Respiratory Function Stable, Patent Airway and No signs of Nausea or vomiting  Post-op Vital Signs: Reviewed and stable  Complications: No complications documented.

## 2020-09-11 ENCOUNTER — Encounter: Payer: Self-pay | Admitting: Ophthalmology

## 2022-06-16 ENCOUNTER — Ambulatory Visit
Admission: EM | Admit: 2022-06-16 | Discharge: 2022-06-16 | Disposition: A | Discharge status: Home / Self Care | Payer: Medicare Other | Attending: Emergency Medicine | Admitting: Emergency Medicine

## 2022-06-16 ENCOUNTER — Other Ambulatory Visit: Payer: Self-pay

## 2022-06-16 ENCOUNTER — Encounter: Payer: Self-pay | Admitting: Emergency Medicine

## 2022-06-16 ENCOUNTER — Ambulatory Visit (INDEPENDENT_AMBULATORY_CARE_PROVIDER_SITE_OTHER): Payer: Medicare Other

## 2022-06-16 DIAGNOSIS — R059 Cough, unspecified: Secondary | ICD-10-CM | POA: Diagnosis not present

## 2022-06-16 DIAGNOSIS — J22 Unspecified acute lower respiratory infection: Secondary | ICD-10-CM | POA: Diagnosis not present

## 2022-06-16 MED ORDER — PREDNISONE 20 MG PO TABS: 40.0000 mg | ORAL_TABLET | Freq: Every day | ORAL | 0 refills | Status: AC

## 2022-06-16 MED ORDER — ALBUTEROL SULFATE HFA 108 (90 BASE) MCG/ACT IN AERS: 1.0000 | INHALATION_SPRAY | RESPIRATORY_TRACT | 0 refills | Status: DC | PRN

## 2022-06-16 MED ORDER — PROMETHAZINE-DM 6.25-15 MG/5ML PO SYRP: 5.0000 mL | ORAL_SOLUTION | Freq: Four times a day (QID) | ORAL | 0 refills | Status: DC | PRN

## 2022-06-16 MED ORDER — AEROCHAMBER MV MISC: 1 refills | Status: DC

## 2022-06-16 MED ORDER — FLUTICASONE PROPIONATE 50 MCG/ACT NA SUSP: 2.0000 | Freq: Every day | NASAL | 0 refills | Status: DC

## 2022-06-16 MED ORDER — IPRATROPIUM-ALBUTEROL 0.5-2.5 (3) MG/3ML IN SOLN
3.0000 mL | Freq: Once | RESPIRATORY_TRACT | Status: AC
  Administered 2022-06-16: 3 mL via RESPIRATORY_TRACT

## 2022-06-16 MED ORDER — DOXYCYCLINE HYCLATE 100 MG PO CAPS: 100.0000 mg | ORAL_CAPSULE | Freq: Two times a day (BID) | ORAL | 0 refills | Status: AC

## 2022-06-16 NOTE — Discharge Instructions (Signed)
Your chest x-ray was negative for pneumonia.  Take 2 puffs from your albuterol inhaler using your spacer every 4 hours for 2 days, then every 6 hours for 2 days, then as needed.  You can back off on the albuterol if you start to improve sooner.  Try Claritin, Allegra or Zyrtec.  Finish the doxycycline and prednisone, even if you feel better.  Start saline nasal irrigation with a Milta Deiters Med rinse and distilled water as often as you want and Flonase.  This will help your allergies

## 2022-06-16 NOTE — ED Provider Notes (Incomplete)
HPI  SUBJECTIVE:  Phillip LEU Sr. is a 71 y.o. male who presents with nonproductive cough for 1 week, wheezing, chest soreness secondary to cough, intermittent allergy symptoms of itchy, watery eyes, sneezing.  He is unable to sleep at night secondary to the cough.  No fevers, nasal congestion, rhinorrhea, postnasal drip, sinus pain or pressure, shortness of breath, dyspnea on exertion.  No unintentional weight gain, lower extremity edema, nocturia, PND, orthopnea, abdominal pain.  No antibiotics in the past 3 months.  No antipyretic in the past 6 hours.  He has tried homeopathic cough medicine and Mucinex DM without improvement in his symptoms.  Symptoms are worse with lying down.  It is not aggravated with cold air or with exposure to a dusty environment.  He has a past medical history of seasonal allergies, worse in the fall, hypertension, borderline diabetes, GERD.  No history of pulmonary disease, smoking, CHF.  [facility]   Past Medical History:  Diagnosis Date   Complication of anesthesia    SLIGHTLY hard to wake up   Diabetes mellitus, type 2 (HCC)    GERD (gastroesophageal reflux disease)    occ-no meds   History of kidney stones    h/o   Hypertension     Past Surgical History:  Procedure Laterality Date   CATARACT EXTRACTION W/PHACO Left 09/10/2020   Procedure: CATARACT EXTRACTION PHACO AND INTRAOCULAR LENS PLACEMENT (IOC) LEFT DIABETIC 4.20 00:32.2;  Surgeon: Birder Robson, MD;  Location: [facility];  Service: Ophthalmology;  Laterality: Left;  Diabetic - oral meds   CHOLECYSTECTOMY     PATELLAR TENDON REPAIR Right 01/06/2018   Procedure: RIGHT KNEE PREPATELLAR BURSA EXCISION WITH ANTERIOR PATELLA SPUR EXCISION;  Surgeon: Hessie Knows, MD;  Location: [facility];  Service: Orthopedics;  Laterality: Right;   WRIST SURGERY Left     History reviewed. No pertinent family history.  Social History   Tobacco Use   Smoking status: Never   Smokeless tobacco:  Never  Vaping Use   Vaping Use: Never used  Substance Use Topics   Alcohol use: No   Drug use: Never    No current facility-administered medications for this encounter.  Current Outpatient Medications:    albuterol (VENTOLIN HFA) 108 (90 Base) MCG/ACT inhaler, Inhale 1-2 puffs into the lungs every 4 (four) hours as needed for wheezing or shortness of breath., Disp: 1 each, Rfl: 0   doxycycline (VIBRAMYCIN) 100 MG capsule, Take 1 capsule (100 mg total) by mouth 2 (two) times daily for 5 days., Disp: 10 capsule, Rfl: 0   fluticasone (FLONASE) 50 MCG/ACT nasal spray, Place 2 sprays into both nostrils daily., Disp: 16 g, Rfl: 0   predniSONE (DELTASONE) 20 MG tablet, Take 2 tablets (40 mg total) by mouth daily with breakfast for 5 days., Disp: 10 tablet, Rfl: 0   promethazine-dextromethorphan (PROMETHAZINE-DM) 6.25-15 MG/5ML syrup, Take 5 mLs by mouth 4 (four) times daily as needed for cough., Disp: 118 mL, Rfl: 0   Spacer/Aero-Holding Chambers (AEROCHAMBER MV) inhaler, Use as instructed, Disp: 1 each, Rfl: 1   ASPIRIN 81 PO, Take by mouth daily., Disp: , Rfl:    atorvastatin (LIPITOR) 40 MG tablet, Take 40 mg by mouth at bedtime., Disp: , Rfl:    losartan (COZAAR) 50 MG tablet, Take 50 mg by mouth daily., Disp: , Rfl:    metFORMIN (GLUCOPHAGE) 500 MG tablet, Take 500 mg by mouth 2 (two) times daily with a meal., Disp: , Rfl:    naproxen sodium (  ALEVE) 220 MG tablet, Take 660 mg by mouth 2 (two) times daily as needed (for pain.)., Disp: , Rfl:    Omega-3 Fatty Acids (FISH OIL) 1000 MG CAPS, Take by mouth daily., Disp: , Rfl:    Probiotic Product (PROBIOTIC DAILY PO), Take by mouth daily., Disp: , Rfl:    tadalafil (CIALIS) 10 MG tablet, Take 10 mg by mouth daily as needed for erectile dysfunction., Disp: , Rfl:    Turmeric 450 MG CAPS, Take by mouth daily., Disp: , Rfl:    vitamin B-12 (CYANOCOBALAMIN) 500 MCG tablet, Take 500 mcg by mouth daily., Disp: , Rfl:   Allergies  Allergen Reactions    Codeine Shortness Of Breath    "jitters and jerks"   Darvon [Propoxyphene] Shortness Of Breath   Shellfish Allergy     Aggravates gout     ROS  As noted in HPI.   Physical Exam  BP 117/75 (BP Location: Left Arm)   Pulse 81   Temp 97.9 F (36.6 C) (Oral)   Resp 18   SpO2 97%   Constitutional: Well developed, well nourished, no acute distress Eyes:  EOMI, conjunctiva normal bilaterally HENT: Normocephalic, atraumatic,mucus membranes moist.  Positive nasal congestion. Erythematous, but not swollen turbinates.  No maxillary, frontal sinus tenderness.  No postnasal drip. Respiratory: Normal inspiratory effort, diffuse inspiratory wheezing, rhonchi/crackles at bases bilaterally.  No anterior, lateral chest wall tenderness Cardiovascular: Normal rate, regular rhythm, no murmurs, rubs, gallops GI: nondistended skin: No rash, skin intact Musculoskeletal: Calves symmetric, nontender, no edema Neurologic: Alert & oriented x 3, no focal neuro deficits Psychiatric: Speech and behavior appropriate   ED Course   Medications  ipratropium-albuterol (DUONEB) 0.5-2.5 (3) MG/3ML nebulizer solution 3 mL (3 mLs Nebulization Given 06/16/22 1649)    Orders Placed This Encounter  Procedures   DG Chest 2 View    Standing Status:   Standing    Number of Occurrences:   1    Order Specific Question:   Reason for Exam (SYMPTOM  OR DIAGNOSIS REQUIRED)    Answer:   Cough 1 week, diffuse wheezing, rhonchi at bases bilaterally.  Rule out pneumonia, pulmonary edema, pleural effusion   Recheck vitals    After duoneb - repeat O2 sat    Standing Status:   Standing    Number of Occurrences:   1    No results found for this or any previous visit (from the past 24 hour(s)). DG Chest 2 View  Result Date: 06/16/2022 CLINICAL DATA:  Cough 1 week rule out pneumonia EXAM: CHEST - 2 VIEW COMPARISON:  Chest 09/30/2009 FINDINGS: The heart size and mediastinal contours are within normal limits. Both lungs  are clear. The visualized skeletal structures are unremarkable. IMPRESSION: No active cardiopulmonary disease. Electronically Signed   By: Franchot Gallo M.D.   On: 06/16/2022 16:45    ED Clinical Impression  1. Acute lower respiratory infection      ED Assessment/Plan     Patient has diffuse wheezing and occasional rhonchi at the bases bilaterally.  Initial O2 sat 93 to 94% on room air.  Will check chest x-ray to rule out pneumonia, pulmonary edema, pleural effusion.  He does not appear to be fluid overloaded.  Will give DuoNeb and reevaluate.  repeat O2 sats 97% on room air.  Improved air movement.  Patient states that he feels slightly better.  Reviewed imaging independently. No PNA. See radiology report for full details.  Patient with a lower respiratory tract infection.  Will send home with regularly scheduled albuterol inhaler with a spacer for 4 days, then as needed, Promethazine DM, Flonase, and will send home on doxycycline for 5 days to cover a lower respiratory tract infection.  Discussed imaging, MDM, treatment plan, and plan for follow-up with patient. Discussed sn/sx that should prompt return to the ED. patient agrees with plan.   Meds ordered this encounter  Medications   ipratropium-albuterol (DUONEB) 0.5-2.5 (3) MG/3ML nebulizer solution 3 mL   promethazine-dextromethorphan (PROMETHAZINE-DM) 6.25-15 MG/5ML syrup    Sig: Take 5 mLs by mouth 4 (four) times daily as needed for cough.    Dispense:  118 mL    Refill:  0   albuterol (VENTOLIN HFA) 108 (90 Base) MCG/ACT inhaler    Sig: Inhale 1-2 puffs into the lungs every 4 (four) hours as needed for wheezing or shortness of breath.    Dispense:  1 each    Refill:  0   Spacer/Aero-Holding Chambers (AEROCHAMBER MV) inhaler    Sig: Use as instructed    Dispense:  1 each    Refill:  1   fluticasone (FLONASE) 50 MCG/ACT nasal spray    Sig: Place 2 sprays into both nostrils daily.    Dispense:  16 g    Refill:  0    predniSONE (DELTASONE) 20 MG tablet    Sig: Take 2 tablets (40 mg total) by mouth daily with breakfast for 5 days.    Dispense:  10 tablet    Refill:  0   doxycycline (VIBRAMYCIN) 100 MG capsule    Sig: Take 1 capsule (100 mg total) by mouth 2 (two) times daily for 5 days.    Dispense:  10 capsule    Refill:  0      *This clinic note was created using [employer]. Therefore, there may be occasional mistakes despite careful proofreading.  ?    Melynda Ripple, MD 06/17/22 Oliver Barre, MD 06/17/22 [phone]

## 2022-06-16 NOTE — ED Triage Notes (Signed)
Symptoms started last week.  Patient has cough, non-productive cough.. patient is sore from coughing.  Patient has noticed wheezing recently

## 2022-06-16 NOTE — ED Triage Notes (Signed)
Has tried mucinex

## 2023-01-17 LAB — LAB REPORT - SCANNED: Result:: POSITIVE

## 2023-01-28 ENCOUNTER — Other Ambulatory Visit: Payer: Self-pay

## 2023-02-03 ENCOUNTER — Other Ambulatory Visit: Payer: Self-pay | Admitting: *Deleted

## 2023-02-03 ENCOUNTER — Telehealth: Payer: Self-pay | Admitting: *Deleted

## 2023-02-03 DIAGNOSIS — R195 Other fecal abnormalities: Secondary | ICD-10-CM

## 2023-02-03 DIAGNOSIS — Z1211 Encounter for screening for malignant neoplasm of colon: Secondary | ICD-10-CM

## 2023-02-03 MED ORDER — PEG 3350-KCL-NABCB-NACL-NASULF 236 G PO SOLR: 4000.0000 mL | Freq: Once | ORAL | 0 refills | Status: AC

## 2023-02-03 NOTE — Telephone Encounter (Signed)
Gastroenterology Pre-Procedure Review  Request Date: 03/19/2023 Requesting Physician: Dr. Allegra Lai  PATIENT REVIEW QUESTIONS: The patient responded to the following health history questions as indicated:    1. Are you having any GI issues? no 2. Do you have a personal history of Polyps? no 3. Do you have a family history of Colon Cancer or Polyps? no 4. Diabetes Mellitus? yes (taking metformin) 5. Joint replacements in the past 12 months?no 6. Major health problems in the past 3 months?no 7. Any artificial heart valves, MVP, or defibrillator?no    MEDICATIONS & ALLERGIES:    Patient reports the following regarding taking any anticoagulation/antiplatelet therapy:   Plavix, Coumadin, Eliquis, Xarelto, Lovenox, Pradaxa, Brilinta, or Effient? no Aspirin? yes (81 mg)  Patient confirms/reports the following medications:  Current Outpatient Medications  Medication Sig Dispense Refill   terbinafine (LAMISIL) 250 MG tablet Take 250 mg by mouth daily.     albuterol (VENTOLIN HFA) 108 (90 Base) MCG/ACT inhaler Inhale 1-2 puffs into the lungs every 4 (four) hours as needed for wheezing or shortness of breath. 1 each 0   ASPIRIN 81 PO Take by mouth daily.     atorvastatin (LIPITOR) 40 MG tablet Take 40 mg by mouth at bedtime.     fluticasone (FLONASE) 50 MCG/ACT nasal spray Place 2 sprays into both nostrils daily. 16 g 0   losartan (COZAAR) 100 MG tablet Take 100 mg by mouth daily.     metFORMIN (GLUCOPHAGE) 500 MG tablet Take 500 mg by mouth 2 (two) times daily with a meal.     naproxen sodium (ALEVE) 220 MG tablet Take 660 mg by mouth 2 (two) times daily as needed (for pain.).     Omega-3 Fatty Acids (FISH OIL) 1000 MG CAPS Take by mouth daily.     Probiotic Product (PROBIOTIC DAILY PO) Take by mouth daily.     promethazine-dextromethorphan (PROMETHAZINE-DM) 6.25-15 MG/5ML syrup Take 5 mLs by mouth 4 (four) times daily as needed for cough. 118 mL 0   Spacer/Aero-Holding Chambers (AEROCHAMBER MV)  inhaler Use as instructed 1 each 1   tadalafil (CIALIS) 10 MG tablet Take 10 mg by mouth daily as needed for erectile dysfunction.     Turmeric 450 MG CAPS Take by mouth daily.     vitamin B-12 (CYANOCOBALAMIN) 500 MCG tablet Take 500 mcg by mouth daily.     No current facility-administered medications for this visit.    Patient confirms/reports the following allergies:  Allergies  Allergen Reactions   Codeine Shortness Of Breath    "jitters and jerks"   Darvon [Propoxyphene] Shortness Of Breath   Shellfish Allergy     Aggravates gout    No orders of the defined types were placed in this encounter.   AUTHORIZATION INFORMATION Primary Insurance: 1D#: Group #:  Secondary Insurance: 1D#: Group #:  SCHEDULE INFORMATION: Date: 03/19/2023 Time: Location:  [facility]

## 2023-03-17 ENCOUNTER — Telehealth: Payer: Self-pay

## 2023-03-17 NOTE — Telephone Encounter (Signed)
PT requested callback to cancel and reschedule procedure because of sinus infection

## 2023-03-17 NOTE — Telephone Encounter (Signed)
Colonoscopy reschedule to 04/22/2023

## 2023-03-17 NOTE — Telephone Encounter (Signed)
Tried to call patient 2 times already and cannot leave message since VM is full

## 2023-04-21 NOTE — Anesthesia Preprocedure Evaluation (Signed)
Anesthesia Evaluation  Patient identified by MRN, date of birth, ID band Patient awake    Reviewed: Allergy & Precautions, H&P , NPO status , Patient's Chart, lab work & pertinent test results  History of Anesthesia Complications Negative for: history of anesthetic complications  Airway Mallampati: II  TM Distance: >3 FB Neck ROM: full    Dental no notable dental hx.    Pulmonary neg pulmonary ROS   Pulmonary exam normal        Cardiovascular hypertension, Pt. on medications Normal cardiovascular exam     Neuro/Psych negative neurological ROS  negative psych ROS   GI/Hepatic Neg liver ROS,GERD  Controlled,,  Endo/Other  diabetes, Type 2, Oral Hypoglycemic Agents    Renal/GU negative Renal ROS  negative genitourinary   Musculoskeletal   Abdominal   Peds  Hematology negative hematology ROS (+)   Anesthesia Other Findings Past Medical History: No date: Complication of anesthesia     Comment:  SLIGHTLY hard to wake up No date: Diabetes mellitus, type 2 (HCC) No date: GERD (gastroesophageal reflux disease)     Comment:  occ-no meds No date: History of kidney stones     Comment:  h/o No date: Hypertension  Past Surgical History: 09/10/2020: CATARACT EXTRACTION W/PHACO; Left     Comment:  Procedure: CATARACT EXTRACTION PHACO AND INTRAOCULAR               LENS PLACEMENT (IOC) LEFT DIABETIC 4.20 00:32.2;                Surgeon: Galen Manila, MD;  [facility]: [facility]              [facility];  Service: Ophthalmology;  Laterality: Left;                Diabetic - oral meds No date: CHOLECYSTECTOMY 01/06/2018: PATELLAR TENDON REPAIR; Right     Comment:  Procedure: RIGHT KNEE PREPATELLAR BURSA EXCISION WITH               ANTERIOR PATELLA SPUR EXCISION;  Surgeon: Kennedy Bucker,               MD;  Location: [facility];  Service: Orthopedics;                Laterality: Right; No date: WRIST SURGERY; Left      Reproductive/Obstetrics negative OB ROS                              Anesthesia Physical Anesthesia Plan  ASA: 2  Anesthesia Plan: General   Post-op Pain Management: Minimal or no pain anticipated   Induction:   PONV Risk Score and Plan: 1 and Propofol infusion and TIVA  Airway Management Planned: Natural Airway  Additional Equipment:   Intra-op Plan:   Post-operative Plan:   Informed Consent:      Dental Advisory Given  Plan Discussed with: CRNA and Surgeon  Anesthesia Plan Comments:          Anesthesia Quick Evaluation

## 2023-04-22 ENCOUNTER — Encounter
Admission: RE | Disposition: A | Discharge status: Home / Self Care | Payer: Self-pay | Source: Home / Self Care | Attending: Gastroenterology

## 2023-04-22 ENCOUNTER — Ambulatory Visit: Payer: Self-pay | Admitting: Anesthesiology

## 2023-04-22 ENCOUNTER — Ambulatory Visit
Admission: RE | Admit: 2023-04-22 | Discharge: 2023-04-22 | Disposition: A | Discharge status: Home / Self Care | Payer: Medicare Other | Attending: Gastroenterology | Admitting: Gastroenterology

## 2023-04-22 DIAGNOSIS — E119 Type 2 diabetes mellitus without complications: Secondary | ICD-10-CM | POA: Insufficient documentation

## 2023-04-22 DIAGNOSIS — I10 Essential (primary) hypertension: Secondary | ICD-10-CM | POA: Diagnosis not present

## 2023-04-22 DIAGNOSIS — K5730 Diverticulosis of large intestine without perforation or abscess without bleeding: Secondary | ICD-10-CM | POA: Diagnosis not present

## 2023-04-22 DIAGNOSIS — D175 Benign lipomatous neoplasm of intra-abdominal organs: Secondary | ICD-10-CM | POA: Diagnosis not present

## 2023-04-22 DIAGNOSIS — K219 Gastro-esophageal reflux disease without esophagitis: Secondary | ICD-10-CM | POA: Insufficient documentation

## 2023-04-22 DIAGNOSIS — Z7984 Long term (current) use of oral hypoglycemic drugs: Secondary | ICD-10-CM | POA: Insufficient documentation

## 2023-04-22 DIAGNOSIS — Z1211 Encounter for screening for malignant neoplasm of colon: Secondary | ICD-10-CM | POA: Insufficient documentation

## 2023-04-22 DIAGNOSIS — R195 Other fecal abnormalities: Secondary | ICD-10-CM

## 2023-04-22 HISTORY — PX: COLONOSCOPY WITH PROPOFOL: SHX5780

## 2023-04-22 LAB — GLUCOSE, CAPILLARY: Glucose-Capillary: 123 mg/dL — ABNORMAL HIGH (ref 70–99)

## 2023-04-22 SURGERY — COLONOSCOPY WITH PROPOFOL
Anesthesia: General

## 2023-04-22 MED ORDER — SODIUM CHLORIDE 0.9 % IV SOLN: INTRAVENOUS | Status: DC

## 2023-04-22 MED ORDER — LIDOCAINE HCL (CARDIAC) PF 100 MG/5ML IV SOSY
PREFILLED_SYRINGE | INTRAVENOUS | Status: DC | PRN
  Administered 2023-04-22: 40 mg via INTRAVENOUS

## 2023-04-22 MED ORDER — PROPOFOL 10 MG/ML IV BOLUS
INTRAVENOUS | Status: DC | PRN
  Administered 2023-04-22: 50 mg via INTRAVENOUS

## 2023-04-22 MED ORDER — PROPOFOL 500 MG/50ML IV EMUL
INTRAVENOUS | Status: DC | PRN
  Administered 2023-04-22: 125 ug/kg/min via INTRAVENOUS

## 2023-04-22 NOTE — Op Note (Signed)
[facility] Gastroenterology Patient Name: Phillip Galloway Procedure Date: 04/22/2023 8:25 AM MRN: [id number] Account #: [id number] Date of Birth: 11-May-1951 Admit Type: Outpatient Age: 72 Room: [facility] ENDO ROOM 3 Gender: Male Note Status: Finalized Instrument Name: Colonoscope [id number] Procedure:             Colonoscopy Indications:           This is the patient's first colonoscopy, Positive                         Cologuard test Providers:             Toney Reil MD, MD Referring MD:          No Local Md, MD (Referring MD) Medicines:             General Anesthesia Complications:         No immediate complications. Estimated blood loss: None. Procedure:             Pre-Anesthesia Assessment:                        - Prior to the procedure, a History and Physical was                         performed, and patient medications and allergies were                         reviewed. The patient is competent. The risks and                         benefits of the procedure and the sedation options and                         risks were discussed with the patient. All questions                         were answered and informed consent was obtained.                         Patient identification and proposed procedure were                         verified by the physician, the nurse, the                         anesthesiologist, the anesthetist and the technician                         in the pre-procedure area in the procedure room in the                         endoscopy suite. Mental Status Examination: alert and                         oriented. Airway Examination: normal oropharyngeal                         airway and neck mobility. Respiratory Examination:  clear to auscultation. CV Examination: normal.                         Prophylactic Antibiotics: The patient does not require                         prophylactic antibiotics. Prior  Anticoagulants: The                         patient has taken no anticoagulant or antiplatelet                         agents. ASA Grade Assessment: III - A patient with                         severe systemic disease. After reviewing the risks and                         benefits, the patient was deemed in satisfactory                         condition to undergo the procedure. The anesthesia                         plan was to use general anesthesia. Immediately prior                         to administration of medications, the patient was                         re-assessed for adequacy to receive sedatives. The                         heart rate, respiratory rate, oxygen saturations,                         blood pressure, adequacy of pulmonary ventilation, and                         response to care were monitored throughout the                         procedure. The physical status of the patient was                         re-assessed after the procedure.                        After obtaining informed consent, the colonoscope was                         passed under direct vision. Throughout the procedure,                         the patient's blood pressure, pulse, and oxygen                         saturations were monitored continuously. The  Colonoscope was introduced through the anus and                         advanced to the the terminal ileum, with                         identification of the appendiceal orifice and IC                         valve. The colonoscopy was performed with moderate                         difficulty due to multiple diverticula in the colon.                         The patient tolerated the procedure well. The quality                         of the bowel preparation was fair. The terminal ileum,                         ileocecal valve, appendiceal orifice, and rectum were                         photographed. Findings:       The perianal and digital rectal examinations were normal. Pertinent       negatives include normal sphincter tone and no palpable rectal lesions.      There was a large lipoma, 20 mm in diameter, in the transverse colon.       The polyp was removed with a hot snare. Resection and retrieval were       complete. Estimated blood loss: none.      Multiple large-mouthed diverticula were found in the left colon. There       was no evidence of diverticular bleeding.      The retroflexed view of the distal rectum and anal verge was normal and       showed no anal or rectal abnormalities.      The terminal ileum appeared normal. Impression:            - Preparation of the colon was fair.                        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                        - Severe diverticulosis in the left colon. There was                         no evidence of diverticular bleeding.                        - The distal rectum and anal verge are normal on                         retroflexion view.                        - The examined portion of the ileum was normal.  Recommendation:        - Discharge patient to home (with escort).                        - Resume previous diet today.                        - Continue present medications.                        - Await pathology results. Procedure Code(s):     --- Professional ---                        [phone], Colonoscopy, flexible; with removal of                         tumor(s), polyp(s), or other lesion(s) by snare                         technique Diagnosis Code(s):     --- Professional ---                        D17.5, Benign lipomatous neoplasm of intra-abdominal                         organs                        R19.5, Other fecal abnormalities                        K57.30, Diverticulosis of large intestine without                         perforation or abscess without bleeding CPT copyright 2022 [employer] Association. All  rights reserved. The codes documented in this report are preliminary and upon coder review may  be revised to meet current compliance requirements. Dr. Libby Maw Toney Reil MD, MD 04/22/2023 9:01:48 AM This report has been signed electronically. Number of Addenda: 0 Note Initiated On: 04/22/2023 8:25 AM Scope Withdrawal Time: 0 hours 12 minutes 31 seconds  Total Procedure Duration: 0 hours 17 minutes 17 seconds  Estimated Blood Loss:  Estimated blood loss: none. Estimated blood loss: none.      [facility]

## 2023-04-22 NOTE — Transfer of Care (Signed)
Immediate Anesthesia Transfer of Care Note  Patient: EVEN BUDLONG Sr.  Procedure(s) Performed: COLONOSCOPY WITH PROPOFOL  Patient Location: PACU  Anesthesia Type:General  Level of Consciousness: drowsy and patient cooperative  Airway & Oxygen Therapy: Patient Spontanous Breathing  Post-op Assessment: Report given to RN and Post -op Vital signs reviewed and stable  Post vital signs: stable  Last Vitals:  Vitals Value Taken Time  BP 107/74 04/22/23 0902  Temp    Pulse 66 04/22/23 0902  Resp 18 04/22/23 0902  SpO2 93 % 04/22/23 0902  Vitals shown include unfiled device data.  Last Pain:  Vitals:   04/22/23 0901  TempSrc:   PainSc: 0-No pain         Complications: No notable events documented.

## 2023-04-22 NOTE — H&P (Signed)
Arlyss Repress, MD [address]  [address]  [place], [place] [address]  Main: [phone]  Fax: [phone] Pager: [phone]  Primary Care Physician:  Lorn Junes, FNP Primary Gastroenterologist:  Dr. Arlyss Repress  Pre-Procedure History & Physical: HPI:  Phillip BUKHARI Sr. is a 72 y.o. male is here for an colonoscopy.   Past Medical History:  Diagnosis Date   Complication of anesthesia    SLIGHTLY hard to wake up   Diabetes mellitus, type 2 (HCC)    GERD (gastroesophageal reflux disease)    occ-no meds   History of kidney stones    h/o   Hypertension     Past Surgical History:  Procedure Laterality Date   CATARACT EXTRACTION W/PHACO Left 09/10/2020   Procedure: CATARACT EXTRACTION PHACO AND INTRAOCULAR LENS PLACEMENT (IOC) LEFT DIABETIC 4.20 00:32.2;  Surgeon: Galen Manila, MD;  Location: [facility];  Service: Ophthalmology;  Laterality: Left;  Diabetic - oral meds   CHOLECYSTECTOMY     PATELLAR TENDON REPAIR Right 01/06/2018   Procedure: RIGHT KNEE PREPATELLAR BURSA EXCISION WITH ANTERIOR PATELLA SPUR EXCISION;  Surgeon: Kennedy Bucker, MD;  Location: [facility];  Service: Orthopedics;  Laterality: Right;   WRIST SURGERY Left     Prior to Admission medications   Medication Sig Start Date End Date Taking? Authorizing Provider  atorvastatin (LIPITOR) 40 MG tablet Take 40 mg by mouth at bedtime.   Yes [provider]  losartan (COZAAR) 100 MG tablet Take 100 mg by mouth daily.   Yes [provider]  metFORMIN (GLUCOPHAGE) 500 MG tablet Take 500 mg by mouth 2 (two) times daily with a meal.   Yes [provider]  albuterol (VENTOLIN HFA) 108 (90 Base) MCG/ACT inhaler Inhale 1-2 puffs into the lungs every 4 (four) hours as needed for wheezing or shortness of breath. 06/16/22   Domenick Gong, MD  ASPIRIN 81 PO Take by mouth daily.    [provider]  fluticasone (FLONASE) 50 MCG/ACT nasal spray Place 2 sprays into  both nostrils daily. 06/16/22   Domenick Gong, MD  naproxen sodium (ALEVE) 220 MG tablet Take 660 mg by mouth 2 (two) times daily as needed (for pain.).    [provider]  Omega-3 Fatty Acids (FISH OIL) 1000 MG CAPS Take by mouth daily.    [provider]  Probiotic Product (PROBIOTIC DAILY PO) Take by mouth daily.    [provider]  promethazine-dextromethorphan (PROMETHAZINE-DM) 6.25-15 MG/5ML syrup Take 5 mLs by mouth 4 (four) times daily as needed for cough. 06/16/22   Domenick Gong, MD  Spacer/Aero-Holding Chambers (AEROCHAMBER MV) inhaler Use as instructed 06/16/22   Domenick Gong, MD  tadalafil (CIALIS) 10 MG tablet Take 10 mg by mouth daily as needed for erectile dysfunction.    [provider]  terbinafine (LAMISIL) 250 MG tablet Take 250 mg by mouth daily. 12/03/22   [provider]  Turmeric 450 MG CAPS Take by mouth daily.    [provider]  vitamin B-12 (CYANOCOBALAMIN) 500 MCG tablet Take 500 mcg by mouth daily.    [provider]    Allergies as of 02/03/2023 - Review Complete 06/16/2022  Allergen Reaction Noted   Codeine Shortness Of Breath 06/29/2017   Darvon [propoxyphene] Shortness Of Breath 09/02/2016   Shellfish allergy  09/03/2020    No family history on file.  Social History   Socioeconomic History   Marital status: Single    Spouse name: Not on file  Number of children: Not on file   Years of education: Not on file   Highest education level: Not on file  Occupational History   Not on file  Tobacco Use   Smoking status: Never   Smokeless tobacco: Never  Vaping Use   Vaping status: Never Used  Substance and Sexual Activity   Alcohol use: No   Drug use: Never   Sexual activity: Not on file  Other Topics Concern   Not on file  Social History Narrative   Not on file   Social Determinants of Health   Financial Resource Strain: Not on file  Food Insecurity: Not on file   Transportation Needs: Not on file  Physical Activity: Not on file  Stress: Not on file  Social Connections: Not on file  Intimate Partner Violence: Not on file    Review of Systems: See HPI, otherwise negative ROS  Physical Exam: BP 134/71   Pulse (!) 59   Temp (!) 96.5 F (35.8 C) (Temporal)   Resp 16   Ht 5\' 9"  (1.753 m)   Wt 90.7 kg   SpO2 98%   BMI 29.53 kg/m  General:   Alert,  pleasant and cooperative in NAD Head:  Normocephalic and atraumatic. Neck:  Supple; no masses or thyromegaly. Lungs:  Clear throughout to auscultation.    Heart:  Regular rate and rhythm. Abdomen:  Soft, nontender and nondistended. Normal bowel sounds, without guarding, and without rebound.   Neurologic:  Alert and  oriented x4;  grossly normal neurologically.  Impression/Plan: Phillip Linea Sr. is here for a colonoscopy to be performed for cologuard positive  Risks, benefits, limitations, and alternatives regarding  colonoscopy have been reviewed with the patient.  Questions have been answered.  All parties agreeable.   [name], MD  04/22/2023, 7:49 AM

## 2023-04-22 NOTE — Anesthesia Postprocedure Evaluation (Signed)
Anesthesia Post Note  Patient: Phillip NICKERSON Sr.  Procedure(s) Performed: COLONOSCOPY WITH PROPOFOL  Patient location during evaluation: Endoscopy Anesthesia Type: General Level of consciousness: awake and alert Pain management: pain level controlled Vital Signs Assessment: post-procedure vital signs reviewed and stable Respiratory status: spontaneous breathing, nonlabored ventilation, respiratory function stable and patient connected to nasal cannula oxygen Cardiovascular status: blood pressure returned to baseline and stable Postop Assessment: no apparent nausea or vomiting Anesthetic complications: no   No notable events documented.   Last Vitals:  Vitals:   04/22/23 0901 04/22/23 0909  BP: 107/74   Pulse:    Resp:    Temp:  (!) 36.1 C  SpO2:      Last Pain:  Vitals:   04/22/23 0909  TempSrc: Temporal  PainSc: 0-No pain                 [name]

## 2023-04-23 ENCOUNTER — Encounter: Payer: Self-pay | Admitting: Gastroenterology

## 2023-04-23 LAB — SURGICAL PATHOLOGY

## 2023-04-27 ENCOUNTER — Encounter: Payer: Self-pay | Admitting: Gastroenterology

## 2023-11-18 DIAGNOSIS — N201 Calculus of ureter: Secondary | ICD-10-CM

## 2023-11-18 HISTORY — DX: Calculus of ureter: N20.1

## 2023-11-21 ENCOUNTER — Emergency Department

## 2023-11-21 ENCOUNTER — Other Ambulatory Visit: Payer: Self-pay

## 2023-11-21 ENCOUNTER — Encounter
Admission: EM | Disposition: A | Discharge status: Home / Self Care | Payer: Self-pay | Source: Home / Self Care | Attending: Internal Medicine

## 2023-11-21 ENCOUNTER — Encounter: Payer: Self-pay | Admitting: Emergency Medicine

## 2023-11-21 ENCOUNTER — Inpatient Hospital Stay
Admission: EM | Admit: 2023-11-21 | Discharge: 2023-11-24 | DRG: 854 | Disposition: A | Discharge status: Home / Self Care | Attending: Internal Medicine | Admitting: Internal Medicine

## 2023-11-21 ENCOUNTER — Inpatient Hospital Stay: Admitting: Anesthesiology

## 2023-11-21 ENCOUNTER — Inpatient Hospital Stay

## 2023-11-21 DIAGNOSIS — I10 Essential (primary) hypertension: Secondary | ICD-10-CM | POA: Diagnosis present

## 2023-11-21 DIAGNOSIS — N35919 Unspecified urethral stricture, male, unspecified site: Secondary | ICD-10-CM | POA: Diagnosis present

## 2023-11-21 DIAGNOSIS — N179 Acute kidney failure, unspecified: Secondary | ICD-10-CM | POA: Diagnosis present

## 2023-11-21 DIAGNOSIS — K219 Gastro-esophageal reflux disease without esophagitis: Secondary | ICD-10-CM | POA: Diagnosis present

## 2023-11-21 DIAGNOSIS — Z604 Social exclusion and rejection: Secondary | ICD-10-CM | POA: Diagnosis present

## 2023-11-21 DIAGNOSIS — B957 Other staphylococcus as the cause of diseases classified elsewhere: Secondary | ICD-10-CM | POA: Diagnosis not present

## 2023-11-21 DIAGNOSIS — Z885 Allergy status to narcotic agent status: Secondary | ICD-10-CM

## 2023-11-21 DIAGNOSIS — N3000 Acute cystitis without hematuria: Secondary | ICD-10-CM

## 2023-11-21 DIAGNOSIS — A411 Sepsis due to other specified staphylococcus: Principal | ICD-10-CM | POA: Diagnosis present

## 2023-11-21 DIAGNOSIS — N132 Hydronephrosis with renal and ureteral calculous obstruction: Secondary | ICD-10-CM

## 2023-11-21 DIAGNOSIS — Z7982 Long term (current) use of aspirin: Secondary | ICD-10-CM | POA: Diagnosis not present

## 2023-11-21 DIAGNOSIS — N136 Pyonephrosis: Secondary | ICD-10-CM | POA: Diagnosis present

## 2023-11-21 DIAGNOSIS — Z79899 Other long term (current) drug therapy: Secondary | ICD-10-CM

## 2023-11-21 DIAGNOSIS — E785 Hyperlipidemia, unspecified: Secondary | ICD-10-CM | POA: Diagnosis present

## 2023-11-21 DIAGNOSIS — E872 Acidosis, unspecified: Secondary | ICD-10-CM | POA: Diagnosis present

## 2023-11-21 DIAGNOSIS — Z7984 Long term (current) use of oral hypoglycemic drugs: Secondary | ICD-10-CM | POA: Diagnosis not present

## 2023-11-21 DIAGNOSIS — R6520 Severe sepsis without septic shock: Secondary | ICD-10-CM | POA: Diagnosis present

## 2023-11-21 DIAGNOSIS — N201 Calculus of ureter: Secondary | ICD-10-CM | POA: Diagnosis not present

## 2023-11-21 DIAGNOSIS — N390 Urinary tract infection, site not specified: Secondary | ICD-10-CM

## 2023-11-21 DIAGNOSIS — Z91013 Allergy to seafood: Secondary | ICD-10-CM

## 2023-11-21 DIAGNOSIS — J449 Chronic obstructive pulmonary disease, unspecified: Secondary | ICD-10-CM

## 2023-11-21 DIAGNOSIS — Z87442 Personal history of urinary calculi: Secondary | ICD-10-CM

## 2023-11-21 DIAGNOSIS — E119 Type 2 diabetes mellitus without complications: Secondary | ICD-10-CM | POA: Diagnosis present

## 2023-11-21 DIAGNOSIS — I2510 Atherosclerotic heart disease of native coronary artery without angina pectoris: Secondary | ICD-10-CM | POA: Diagnosis present

## 2023-11-21 DIAGNOSIS — A419 Sepsis, unspecified organism: Principal | ICD-10-CM | POA: Diagnosis present

## 2023-11-21 DIAGNOSIS — Z23 Encounter for immunization: Secondary | ICD-10-CM | POA: Diagnosis present

## 2023-11-21 DIAGNOSIS — R7881 Bacteremia: Secondary | ICD-10-CM

## 2023-11-21 DIAGNOSIS — N200 Calculus of kidney: Secondary | ICD-10-CM

## 2023-11-21 DIAGNOSIS — N400 Enlarged prostate without lower urinary tract symptoms: Secondary | ICD-10-CM | POA: Diagnosis present

## 2023-11-21 DIAGNOSIS — R109 Unspecified abdominal pain: Secondary | ICD-10-CM | POA: Diagnosis present

## 2023-11-21 DIAGNOSIS — Z9049 Acquired absence of other specified parts of digestive tract: Secondary | ICD-10-CM | POA: Diagnosis not present

## 2023-11-21 HISTORY — DX: Sepsis, unspecified organism: A41.9

## 2023-11-21 HISTORY — PX: CYSTOSCOPY W/ URETERAL STENT PLACEMENT: SHX1429

## 2023-11-21 HISTORY — DX: Sepsis, unspecified organism: R65.20

## 2023-11-21 LAB — CBC
HCT: 42.3 % (ref 39.0–52.0)
Hemoglobin: 13.8 g/dL (ref 13.0–17.0)
MCH: 30.9 pg (ref 26.0–34.0)
MCHC: 32.6 g/dL (ref 30.0–36.0)
MCV: 94.6 fL (ref 80.0–100.0)
Platelets: 320 10*3/uL (ref 150–400)
RBC: 4.47 MIL/uL (ref 4.22–5.81)
RDW: 13.3 % (ref 11.5–15.5)
WBC: 18.9 10*3/uL — ABNORMAL HIGH (ref 4.0–10.5)
nRBC: 0 % (ref 0.0–0.2)

## 2023-11-21 LAB — URINALYSIS, ROUTINE W REFLEX MICROSCOPIC
Bilirubin Urine: NEGATIVE
Glucose, UA: 150 mg/dL — AB
Ketones, ur: NEGATIVE mg/dL
Nitrite: POSITIVE — AB
Protein, ur: 30 mg/dL — AB
Specific Gravity, Urine: 1.019 (ref 1.005–1.030)
WBC, UA: >50 WBC/hpf (ref 0–5)
pH: 6 (ref 5.0–8.0)

## 2023-11-21 LAB — TROPONIN I (HIGH SENSITIVITY)
Troponin I (High Sensitivity): 10 ng/L (ref <18)
Troponin I (High Sensitivity): 10 ng/L (ref <18)

## 2023-11-21 LAB — BASIC METABOLIC PANEL WITH GFR
Anion gap: 12 (ref 5–15)
BUN: 18 mg/dL (ref 8–23)
CO2: 21 mmol/L — ABNORMAL LOW (ref 22–32)
Calcium: 8.7 mg/dL — ABNORMAL LOW (ref 8.9–10.3)
Chloride: 101 mmol/L (ref 98–111)
Creatinine, Ser: 1.9 mg/dL — ABNORMAL HIGH (ref 0.61–1.24)
GFR, Estimated: 37 mL/min — ABNORMAL LOW (ref >60)
Glucose, Bld: 208 mg/dL — ABNORMAL HIGH (ref 70–99)
Potassium: 4.1 mmol/L (ref 3.5–5.1)
Sodium: 134 mmol/L — ABNORMAL LOW (ref 135–145)

## 2023-11-21 LAB — CBG MONITORING, ED: Glucose-Capillary: 131 mg/dL — ABNORMAL HIGH (ref 70–99)

## 2023-11-21 LAB — GLUCOSE, CAPILLARY: Glucose-Capillary: 207 mg/dL — ABNORMAL HIGH (ref 70–99)

## 2023-11-21 LAB — HEPATIC FUNCTION PANEL
ALT: 31 U/L (ref 0–44)
AST: 22 U/L (ref 15–41)
Albumin: 3.5 g/dL (ref 3.5–5.0)
Alkaline Phosphatase: 82 U/L (ref 38–126)
Bilirubin, Direct: 0.3 mg/dL — ABNORMAL HIGH (ref 0.0–0.2)
Indirect Bilirubin: 0.8 mg/dL (ref 0.3–0.9)
Total Bilirubin: 1.1 mg/dL (ref 0.0–1.2)
Total Protein: 7.2 g/dL (ref 6.5–8.1)

## 2023-11-21 LAB — LACTIC ACID, PLASMA
Lactic Acid, Venous: 3.4 mmol/L (ref 0.5–1.9)
Lactic Acid, Venous: 3 mmol/L (ref 0.5–1.9)

## 2023-11-21 LAB — LIPASE, BLOOD: Lipase: 27 U/L (ref 11–51)

## 2023-11-21 LAB — HEMOGLOBIN A1C
Hgb A1c MFr Bld: 7.4 % — ABNORMAL HIGH (ref 4.8–5.6)
Mean Plasma Glucose: 165.68 mg/dL

## 2023-11-21 SURGERY — CYSTOSCOPY, WITH RETROGRADE PYELOGRAM AND URETERAL STENT INSERTION
Anesthesia: General | Site: Ureter | Laterality: Left

## 2023-11-21 MED ORDER — ASPIRIN 81 MG PO CHEW
81.0000 mg | CHEWABLE_TABLET | Freq: Every day | ORAL | Status: DC
  Administered 2023-11-22 – 2023-11-24 (×3): 81 mg via ORAL
  Filled 2023-11-21 (×3): qty 1

## 2023-11-21 MED ORDER — PROPOFOL 10 MG/ML IV BOLUS
INTRAVENOUS | Status: AC
  Filled 2023-11-21: qty 20

## 2023-11-21 MED ORDER — ONDANSETRON HCL 4 MG/2ML IJ SOLN: 4.0000 mg | Freq: Four times a day (QID) | INTRAMUSCULAR | Status: DC | PRN

## 2023-11-21 MED ORDER — LIDOCAINE HCL (CARDIAC) PF 100 MG/5ML IV SOSY
PREFILLED_SYRINGE | INTRAVENOUS | Status: DC | PRN
Start: 2023-11-21 — End: 2023-11-21
  Administered 2023-11-21: 100 mg via INTRAVENOUS

## 2023-11-21 MED ORDER — ONDANSETRON HCL 4 MG/2ML IJ SOLN
INTRAMUSCULAR | Status: DC | PRN
  Administered 2023-11-21: 4 mg via INTRAVENOUS

## 2023-11-21 MED ORDER — SODIUM CHLORIDE 0.9 % IV SOLN: INTRAVENOUS | Status: AC

## 2023-11-21 MED ORDER — HYDROMORPHONE HCL 1 MG/ML IJ SOLN
0.5000 mg | INTRAMUSCULAR | Status: DC | PRN
Start: 2023-11-21 — End: 2023-11-24

## 2023-11-21 MED ORDER — IOHEXOL 300 MG/ML  SOLN
100.0000 mL | Freq: Once | INTRAMUSCULAR | Status: AC | PRN
  Administered 2023-11-21: 100 mL via INTRAVENOUS

## 2023-11-21 MED ORDER — SODIUM CHLORIDE 0.9 % IR SOLN
Status: DC | PRN
  Administered 2023-11-21: 3000 mL

## 2023-11-21 MED ORDER — ENOXAPARIN SODIUM 40 MG/0.4ML IJ SOSY
40.0000 mg | PREFILLED_SYRINGE | INTRAMUSCULAR | Status: DC
  Administered 2023-11-21 – 2023-11-23 (×3): 40 mg via SUBCUTANEOUS
  Filled 2023-11-21 (×3): qty 0.4

## 2023-11-21 MED ORDER — IOHEXOL 180 MG/ML  SOLN
INTRAMUSCULAR | Status: DC | PRN
  Administered 2023-11-21: 10 mL

## 2023-11-21 MED ORDER — RISAQUAD PO CAPS
1.0000 | ORAL_CAPSULE | Freq: Every day | ORAL | Status: DC
  Administered 2023-11-22 – 2023-11-24 (×3): 1 via ORAL
  Filled 2023-11-21 (×3): qty 1

## 2023-11-21 MED ORDER — OXYCODONE HCL 5 MG/5ML PO SOLN: 5.0000 mg | Freq: Once | ORAL | Status: DC | PRN

## 2023-11-21 MED ORDER — ACETAMINOPHEN 325 MG PO TABS: 650.0000 mg | ORAL_TABLET | Freq: Four times a day (QID) | ORAL | Status: DC | PRN

## 2023-11-21 MED ORDER — ONDANSETRON HCL 4 MG PO TABS: 4.0000 mg | ORAL_TABLET | Freq: Four times a day (QID) | ORAL | Status: DC | PRN

## 2023-11-21 MED ORDER — FENTANYL CITRATE (PF) 100 MCG/2ML IJ SOLN: 25.0000 ug | INTRAMUSCULAR | Status: DC | PRN

## 2023-11-21 MED ORDER — CHLORHEXIDINE GLUCONATE CLOTH 2 % EX PADS
6.0000 | MEDICATED_PAD | Freq: Every day | CUTANEOUS | Status: DC
  Administered 2023-11-22 – 2023-11-24 (×3): 6 via TOPICAL

## 2023-11-21 MED ORDER — DEXAMETHASONE SODIUM PHOSPHATE 10 MG/ML IJ SOLN
INTRAMUSCULAR | Status: DC | PRN
  Administered 2023-11-21: 10 mg via INTRAVENOUS

## 2023-11-21 MED ORDER — ALBUTEROL SULFATE (2.5 MG/3ML) 0.083% IN NEBU: 2.5000 mg | INHALATION_SOLUTION | RESPIRATORY_TRACT | Status: DC | PRN

## 2023-11-21 MED ORDER — HYDRALAZINE HCL 20 MG/ML IJ SOLN: 5.0000 mg | Freq: Four times a day (QID) | INTRAMUSCULAR | Status: DC | PRN

## 2023-11-21 MED ORDER — SODIUM CHLORIDE 0.9 % IV SOLN
2.0000 g | INTRAVENOUS | Status: DC
  Administered 2023-11-22: 2 g via INTRAVENOUS
  Filled 2023-11-21: qty 20

## 2023-11-21 MED ORDER — ATORVASTATIN CALCIUM 20 MG PO TABS
40.0000 mg | ORAL_TABLET | Freq: Every day | ORAL | Status: DC
  Administered 2023-11-21 – 2023-11-23 (×3): 40 mg via ORAL
  Filled 2023-11-21 (×3): qty 2

## 2023-11-21 MED ORDER — HYDROMORPHONE HCL 1 MG/ML IJ SOLN
0.5000 mg | Freq: Once | INTRAMUSCULAR | Status: AC
  Administered 2023-11-21: 0.5 mg via INTRAVENOUS
  Filled 2023-11-21: qty 0.5

## 2023-11-21 MED ORDER — PHENYLEPHRINE 80 MCG/ML (10ML) SYRINGE FOR IV PUSH (FOR BLOOD PRESSURE SUPPORT)
PREFILLED_SYRINGE | INTRAVENOUS | Status: DC | PRN
  Administered 2023-11-21 (×3): 160 ug via INTRAVENOUS

## 2023-11-21 MED ORDER — SODIUM CHLORIDE 0.9 % IV BOLUS: 1000.0000 mL | Freq: Once | INTRAVENOUS | Status: DC

## 2023-11-21 MED ORDER — FLUTICASONE PROPIONATE 50 MCG/ACT NA SUSP
2.0000 | Freq: Every day | NASAL | Status: DC
  Administered 2023-11-22 – 2023-11-24 (×3): 2 via NASAL
  Filled 2023-11-21: qty 16

## 2023-11-21 MED ORDER — LACTATED RINGERS IV BOLUS
1000.0000 mL | Freq: Once | INTRAVENOUS | Status: AC
  Administered 2023-11-21: 1000 mL via INTRAVENOUS

## 2023-11-21 MED ORDER — PROPOFOL 10 MG/ML IV BOLUS
INTRAVENOUS | Status: DC | PRN
  Administered 2023-11-21: 150 mg via INTRAVENOUS

## 2023-11-21 MED ORDER — ACETAMINOPHEN 500 MG PO TABS
1000.0000 mg | ORAL_TABLET | Freq: Once | ORAL | Status: AC
  Administered 2023-11-21: 1000 mg via ORAL
  Filled 2023-11-21: qty 2

## 2023-11-21 MED ORDER — ONDANSETRON HCL 4 MG/2ML IJ SOLN
4.0000 mg | Freq: Once | INTRAMUSCULAR | Status: AC
  Administered 2023-11-21: 4 mg via INTRAVENOUS
  Filled 2023-11-21: qty 2

## 2023-11-21 MED ORDER — ALBUTEROL SULFATE HFA 108 (90 BASE) MCG/ACT IN AERS: 1.0000 | INHALATION_SPRAY | RESPIRATORY_TRACT | Status: DC | PRN

## 2023-11-21 MED ORDER — OXYCODONE HCL 5 MG PO TABS: 5.0000 mg | ORAL_TABLET | Freq: Once | ORAL | Status: DC | PRN

## 2023-11-21 MED ORDER — FENTANYL CITRATE (PF) 100 MCG/2ML IJ SOLN
INTRAMUSCULAR | Status: DC | PRN
  Administered 2023-11-21: 50 ug via INTRAVENOUS

## 2023-11-21 MED ORDER — FENTANYL CITRATE (PF) 100 MCG/2ML IJ SOLN
INTRAMUSCULAR | Status: AC
  Filled 2023-11-21: qty 2

## 2023-11-21 MED ORDER — SODIUM CHLORIDE 0.9 % IV SOLN
2.0000 g | Freq: Once | INTRAVENOUS | Status: AC
  Administered 2023-11-21: 2 g via INTRAVENOUS
  Filled 2023-11-21: qty 20

## 2023-11-21 MED ORDER — INSULIN ASPART 100 UNIT/ML IJ SOLN
0.0000 [IU] | Freq: Three times a day (TID) | INTRAMUSCULAR | Status: DC
Start: 2023-11-22 — End: 2023-11-24
  Administered 2023-11-22: 2 [IU] via SUBCUTANEOUS
  Administered 2023-11-22: 5 [IU] via SUBCUTANEOUS
  Administered 2023-11-22 – 2023-11-23 (×2): 2 [IU] via SUBCUTANEOUS
  Administered 2023-11-24: 1 [IU] via SUBCUTANEOUS
  Filled 2023-11-21 (×5): qty 1

## 2023-11-21 SURGICAL SUPPLY — 19 items
BAG DRAIN SIEMENS DORNER NS (MISCELLANEOUS) ×1 IMPLANT
BRUSH SCRUB EZ 4% CHG (MISCELLANEOUS) ×1 IMPLANT
CATH URETL OPEN 5X70 (CATHETERS) ×1 IMPLANT
DRAPE UTILITY 15X26 TOWEL STRL (DRAPES) ×1 IMPLANT
GLOVE BIO SURGEON STRL SZ 6.5 (GLOVE) ×1 IMPLANT
GLOVE BIO SURGEON STRL SZ7 (GLOVE) IMPLANT
GOWN STRL REUS W/ TWL XL LVL3 (GOWN DISPOSABLE) ×1 IMPLANT
GUIDEWIRE STR DUAL SENSOR (WIRE) ×1 IMPLANT
HOLDER FOLEY CATH W/STRAP (MISCELLANEOUS) IMPLANT
KIT TURNOVER CYSTO (KITS) ×1 IMPLANT
PACK CYSTO AR (MISCELLANEOUS) ×1 IMPLANT
SET CYSTO W/LG BORE CLAMP LF (SET/KITS/TRAYS/PACK) ×1 IMPLANT
SOL .9 NS 3000ML IRR UROMATIC (IV SOLUTION) ×1 IMPLANT
STENT URET 6FRX24 CONTOUR (STENTS) ×1 IMPLANT
STENT URET 6FRX26 CONTOUR (STENTS) ×1 IMPLANT
SURGILUBE 2OZ TUBE FLIPTOP (MISCELLANEOUS) ×1 IMPLANT
TRAY FOLEY MTR SLVR 16FR STAT (SET/KITS/TRAYS/PACK) IMPLANT
WATER STERILE IRR 1000ML POUR (IV SOLUTION) ×1 IMPLANT
WATER STERILE IRR 500ML POUR (IV SOLUTION) ×1 IMPLANT

## 2023-11-21 NOTE — H&P (Signed)
 History and Physical    Phillip Galloway. ZOX:[id number] DOB: 11-Aug-1950 DOA: 11/21/2023  PCP: Evelena Hines, FNP (Inactive) (Confirm with patient/family/NH records and if not entered, this has to be entered at [facility] point of entry) Patient coming from: Home   I have personally briefly reviewed patient's old medical records in [facility]  Chief Complaint: Left flank pain, urine blocked  HPI: Phillip Galloway. is a 73 y.o. male with medical history significant of kidney stones HTN, HLD,IIDM, COPD, presented with sudden onset of left-sided flank pain and trouble to urinate.  Symptoms started yesterday morning, patient started to have sudden onset of left sided flank pain radiating to left groin area associated with dysuria.  He also complains about dysuria and subjective fever and chills, and decreased urinary output.  No nausea or vomiting.  He attributed his symptoms to " kidney stone", for he had a similar process 8 to 9 years ago and when he was treated with ureter stent and antibiotics.  ED Course: Fever 101.4, blood pressure 158/83 O2 saturation 97% on room air.  CT abdomen pelvis showed obstructing left ureteral stone 4 mm, blood work showed WBC 18.9 BUN 18 creatinine 1.9 compared to baseline less than 1.0, bicarb 21K 4.1.  Code sepsis was called, patient was given IV bolus 3000 mL saline and ceftriaxone .  Review of Systems: As per HPI otherwise 14 point review of systems negative.    Past Medical History:  Diagnosis Date   Complication of anesthesia    SLIGHTLY hard to wake up   Diabetes mellitus, type 2 (HCC)    GERD (gastroesophageal reflux disease)    occ-no meds   History of kidney stones    h/o   Hypertension     Past Surgical History:  Procedure Laterality Date   CATARACT EXTRACTION W/PHACO Left 09/10/2020   Procedure: CATARACT EXTRACTION PHACO AND INTRAOCULAR LENS PLACEMENT (IOC) LEFT DIABETIC 4.20 00:32.2;  Surgeon: Clair Crews, MD;  Location: [facility];  Service: Ophthalmology;  Laterality: Left;  Diabetic - oral meds   CHOLECYSTECTOMY     COLONOSCOPY WITH PROPOFOL  N/A 04/22/2023   Procedure: COLONOSCOPY WITH PROPOFOL ;  Surgeon: Selena Daily, MD;  Location: [facility];  Service: Gastroenterology;  Laterality: N/A;   PATELLAR TENDON REPAIR Right 01/06/2018   Procedure: RIGHT KNEE PREPATELLAR BURSA EXCISION WITH ANTERIOR PATELLA SPUR EXCISION;  Surgeon: Molli Angelucci, MD;  Location: [facility];  Service: Orthopedics;  Laterality: Right;   WRIST SURGERY Left      reports that he has never smoked. He has never used smokeless tobacco. He reports that he does not drink alcohol and does not use drugs.  Allergies  Allergen Reactions   Codeine Shortness Of Breath    "jitters and jerks"   Darvon [Propoxyphene] Shortness Of Breath   Corn Syrup [Glucose] Other (See Comments)    Gout attack   Shellfish Allergy     Aggravates gout    History reviewed. No pertinent family history.   Prior to Admission medications   Medication Sig Start Date End Date Taking? Authorizing Provider  albuterol  (VENTOLIN  HFA) 108 (90 Base) MCG/ACT inhaler Inhale 1-2 puffs into the lungs every 4 (four) hours as needed for wheezing or shortness of breath. 06/16/22   Ethlyn Herd, MD  ASPIRIN 81 PO Take by mouth daily.    [provider]  atorvastatin (LIPITOR) 40 MG tablet Take 40 mg by mouth at bedtime.    [provider]  fluticasone  (  FLONASE ) 50 MCG/ACT nasal spray Place 2 sprays into both nostrils daily. 06/16/22   Ethlyn Herd, MD  losartan (COZAAR) 100 MG tablet Take 100 mg by mouth daily.    [provider]  metFORMIN (GLUCOPHAGE) 500 MG tablet Take 500 mg by mouth 2 (two) times daily with a meal.    [provider]  naproxen  sodium (ALEVE ) 220 MG tablet Take 660 mg by mouth 2 (two) times daily as needed (for pain.).    [provider]  Omega-3 Fatty Acids (FISH OIL) 1000 MG CAPS Take by mouth  daily.    [provider]  Probiotic Product (PROBIOTIC DAILY PO) Take by mouth daily.    [provider]  promethazine -dextromethorphan (PROMETHAZINE -DM) 6.25-15 MG/5ML syrup Take 5 mLs by mouth 4 (four) times daily as needed for cough. 06/16/22   Ethlyn Herd, MD  Spacer/Aero-Holding Chambers (AEROCHAMBER MV) inhaler Use as instructed 06/16/22   Ethlyn Herd, MD  tadalafil (CIALIS) 10 MG tablet Take 10 mg by mouth daily as needed for erectile dysfunction.    [provider]  terbinafine (LAMISIL) 250 MG tablet Take 250 mg by mouth daily. 12/03/22   [provider]  Turmeric 450 MG CAPS Take by mouth daily.    [provider]  vitamin B-12 (CYANOCOBALAMIN) 500 MCG tablet Take 500 mcg by mouth daily.    [provider]    Physical Exam: Vitals:   11/21/23 1339 11/21/23 1340 11/21/23 1631  BP: (!) 158/83    Pulse: 91    Resp: 17    Temp: (!) 97.5 F (36.4 C)  (!) 101.4 F (38.6 C)  TempSrc: Oral  Oral  SpO2: 97%    Weight:  90.7 kg   Height:  5' 9.5" (1.765 m)     Constitutional: NAD, calm, comfortable Vitals:   11/21/23 1339 11/21/23 1340 11/21/23 1631  BP: (!) 158/83    Pulse: 91    Resp: 17    Temp: (!) 97.5 F (36.4 C)  (!) 101.4 F (38.6 C)  TempSrc: Oral  Oral  SpO2: 97%    Weight:  90.7 kg   Height:  5' 9.5" (1.765 m)    Eyes: PERRL, lids and conjunctivae normal ENMT: Mucous membranes are moist. Posterior pharynx clear of any exudate or lesions.Normal dentition.  Neck: normal, supple, no masses, no thyromegaly Respiratory: clear to auscultation bilaterally, no wheezing, no crackles. Normal respiratory effort. No accessory muscle use.  Cardiovascular: Regular rate and rhythm, no murmurs / rubs / gallops. No extremity edema. 2+ pedal pulses. No carotid bruits.  Abdomen: left CVA tenderness, no masses palpated. No hepatosplenomegaly. Bowel sounds positive.  Musculoskeletal: no clubbing / cyanosis. No joint  deformity upper and lower extremities. Good ROM, no contractures. Normal muscle tone.  Skin: no rashes, lesions, ulcers. No induration Neurologic: CN 2-12 grossly intact. Sensation intact, DTR normal. Strength 5/5 in all 4.  Psychiatric: Normal judgment and insight. Alert and oriented x 3. Normal mood.    Labs on Admission: I have personally reviewed following labs and imaging studies  CBC: Recent Labs  Lab 11/21/23 1342  WBC 18.9*  HGB 13.8  HCT 42.3  MCV 94.6  PLT 320   Basic Metabolic Panel: Recent Labs  Lab 11/21/23 1342  NA 134*  K 4.1  CL 101  CO2 21*  GLUCOSE 208*  BUN 18  CREATININE 1.90*  CALCIUM 8.7*   GFR: Estimated Creatinine Clearance: 38.3 mL/min (A) (by C-G formula based on SCr of 1.9 mg/dL (  H)). Liver Function Tests: Recent Labs  Lab 11/21/23 1342  AST 22  ALT 31  ALKPHOS 82  BILITOT 1.1  PROT 7.2  ALBUMIN 3.5   Recent Labs  Lab 11/21/23 1342  LIPASE 27   No results for input(s): "AMMONIA" in the last 168 hours. Coagulation Profile: No results for input(s): "INR", "PROTIME" in the last 168 hours. Cardiac Enzymes: No results for input(s): "CKTOTAL", "CKMB", "CKMBINDEX", "TROPONINI" in the last 168 hours. BNP (last 3 results) No results for input(s): "PROBNP" in the last 8760 hours. HbA1C: No results for input(s): "HGBA1C" in the last 72 hours. CBG: No results for input(s): "GLUCAP" in the last 168 hours. Lipid Profile: No results for input(s): "CHOL", "HDL", "LDLCALC", "TRIG", "CHOLHDL", "LDLDIRECT" in the last 72 hours. Thyroid Function Tests: No results for input(s): "TSH", "T4TOTAL", "FREET4", "T3FREE", "THYROIDAB" in the last 72 hours. Anemia Panel: No results for input(s): "VITAMINB12", "FOLATE", "FERRITIN", "TIBC", "IRON", "RETICCTPCT" in the last 72 hours. Urine analysis:    Component Value Date/Time   COLORURINE YELLOW (A) 11/21/2023 1342   APPEARANCEUR HAZY (A) 11/21/2023 1342   LABSPEC 1.019 11/21/2023 1342   PHURINE 6.0  11/21/2023 1342   GLUCOSEU 150 (A) 11/21/2023 1342   HGBUR MODERATE (A) 11/21/2023 1342   BILIRUBINUR NEGATIVE 11/21/2023 1342   KETONESUR NEGATIVE 11/21/2023 1342   PROTEINUR 30 (A) 11/21/2023 1342   NITRITE POSITIVE (A) 11/21/2023 1342   LEUKOCYTESUR LARGE (A) 11/21/2023 1342    Radiological Exams on Admission: CT ABDOMEN PELVIS W CONTRAST Result Date: 11/21/2023 CLINICAL DATA:  Acute abdominal and left-sided flank pain. Nephrolithiasis. EXAM: CT ABDOMEN AND PELVIS WITH CONTRAST TECHNIQUE: Multidetector CT imaging of the abdomen and pelvis was performed using the standard protocol following bolus administration of intravenous contrast. RADIATION DOSE REDUCTION: This exam was performed according to the departmental dose-optimization program which includes automated exposure control, adjustment of the mA and/or kV according to patient size and/or use of iterative reconstruction technique. CONTRAST:  100mL OMNIPAQUE IOHEXOL 300 MG/ML  SOLN COMPARISON:  Noncontrast CT on 09/02/2016 FINDINGS: Lower Chest: No acute findings. Hepatobiliary: No suspicious hepatic masses identified. Prior cholecystectomy. No evidence of biliary obstruction. Pancreas:  No mass or inflammatory changes. Spleen: Within normal limits in size and appearance. Adrenals/Urinary Tract: No suspicious masses identified. Several small less than 1 cm renal calculi are again seen bilaterally. Mild left hydroureteronephrosis and perinephric stranding is seen due to a 4 mm distal left ureteral calculus. Stomach/Bowel: No evidence of obstruction, inflammatory process or abnormal fluid collections. Normal appendix visualized. Diverticulosis is seen mainly involving the descending and sigmoid colon, however there is no evidence of diverticulitis. Vascular/Lymphatic: No pathologically enlarged lymph nodes. No acute vascular findings. Reproductive:  Stable mildly enlarged prostate. Other:  None. Musculoskeletal:  No suspicious bone lesions  identified. IMPRESSION: Mild left hydroureteronephrosis and perinephric stranding due to 4 mm distal left ureteral calculus. Bilateral nephrolithiasis. Colonic diverticulosis, without radiographic evidence of diverticulitis. Stable mildly enlarged prostate. Electronically Signed   By: Marlyce Sine M.D.   On: 11/21/2023 16:06    EKG: Independently reviewed.  Sinus rhythm, no acute ST changes.  Assessment/Plan Principal Problem:   Sepsis (HCC) Active Problems:   Ureteral stone with hydronephrosis   Complicated UTI (urinary tract infection)  (please populate well all problems here in Problem List. (For example, if patient is on BP meds at home and you resume or decide to hold them, it is a problem that needs to be her. Same for CAD, COPD, HLD and so on)  Sepsis with acute organ damage - Sepsis is evidenced by new onset of fever, leukocytosis elevated lactic acid, source infection is complicated UTI vs early pyelonephritis secondary to obstructive left-sided ureteral stone - Urology consulted, OR this afternoon for stenting of left ureter - Continue ceftriaxone  - Continue IV resuscitation  AKI - Likely secondary to a combined effect of post renal etiology of left obstructive ureteral stone as well as prerenal etiology from sepsis - Urology procedure as above - IV fluid - Recheck kidney function tomorrow - Hold off lisinopril and metformin  HTN - As needed hydralazine  IIDM -SSI - Hold off metformin  COPD - Stable, no acute concern - Continue home regimen, breathing medications  DVT prophylaxis: Lovenox Code Status: Full code Family Communication: None at bedside Disposition Plan: Patient is sick with urosepsis secondary to left-sided obstructive ureteral stone requiring inpatient urology intervention, IV fluid resuscitation and IV antibiotics, expect more than 2 midnight hospital stay Consults called: Urology Admission status: Telemetry admission   [place] MD [facility] Pager [phone]  11/21/2023, 5:05 PM

## 2023-11-21 NOTE — ED Triage Notes (Signed)
 Patient to ED via POV for left sided flank pain. Started yesterday with difficulty urinating. Hx of kidney stones.

## 2023-11-21 NOTE — Progress Notes (Signed)
 CODE SEPSIS - PHARMACY COMMUNICATION  **Broad Spectrum Antibiotics should be administered within 1 hour of Sepsis diagnosis**  Time Code Sepsis Called/Page Received: 1530  Antibiotics Ordered: ceftriaxone   Time of 1st antibiotic administration: 1555  Additional action taken by pharmacy: None  If necessary, Name of Provider/Nurse Contacted: None    Ananias Balls ,PharmD Clinical Pharmacist  11/21/2023  3:38 PM

## 2023-11-21 NOTE — ED Provider Notes (Signed)
 [facility] Provider Note    Event Date/Time   First MD Initiated Contact with Patient 11/21/23 1425     (approximate)   History   Flank Pain   HPI  Phillip Galloway. is a 73 y.o. male who comes in with left-sided flank pain and some difficulty with urination.  Patient reports has had kidney stones before.  He states that he feels this could be another kidney stone.  He denies any known fevers.  He reports the pain on the left side.  Denies any chest pain, shortness of breath     Physical Exam   Triage Vital Signs: ED Triage Vitals  Encounter Vitals Group     BP 11/21/23 1339 (!) 158/83     Systolic BP Percentile --      Diastolic BP Percentile --      Pulse Rate 11/21/23 1339 91     Resp 11/21/23 1339 17     Temp 11/21/23 1339 (!) 97.5 F (36.4 C)     Temp Source 11/21/23 1339 Oral     SpO2 11/21/23 1339 97 %     Weight 11/21/23 1340 200 lb (90.7 kg)     Height 11/21/23 1340 5' 9.5" (1.765 m)     Head Circumference --      Peak Flow --      Pain Score 11/21/23 1340 5     Pain Loc --      Pain Education --      Exclude from Growth Chart --     Most recent vital signs: Vitals:   11/21/23 1339  BP: (!) 158/83  Pulse: 91  Resp: 17  Temp: (!) 97.5 F (36.4 C)  SpO2: 97%     General: Awake, no distress.  CV:  Good peripheral perfusion.  Resp:  Normal effort.  Abd:  No distention.  Slight tenderness in the left abdomen Other:     ED Results / Procedures / Treatments   Labs (all labs ordered are listed, but only abnormal results are displayed) Labs Reviewed  URINALYSIS, ROUTINE W REFLEX MICROSCOPIC - Abnormal; Notable for the following components:      Result Value   Color, Urine YELLOW (*)    APPearance HAZY (*)    Glucose, UA 150 (*)    Hgb urine dipstick MODERATE (*)    Protein, ur 30 (*)    Nitrite POSITIVE (*)    Leukocytes,Ua LARGE (*)    Bacteria, UA RARE (*)    All other components within normal limits  BASIC  METABOLIC PANEL WITH GFR - Abnormal; Notable for the following components:   Sodium 134 (*)    CO2 21 (*)    Glucose, Bld 208 (*)    Creatinine, Ser 1.90 (*)    Calcium 8.7 (*)    GFR, Estimated 37 (*)    All other components within normal limits  CBC - Abnormal; Notable for the following components:   WBC 18.9 (*)    All other components within normal limits  HEPATIC FUNCTION PANEL - Abnormal; Notable for the following components:   Bilirubin, Direct 0.3 (*)    All other components within normal limits  LIPASE, BLOOD  TROPONIN I (HIGH SENSITIVITY)     EKG  My interpretation of EKG:  Normal sinus rhythm 80 without any ST elevation or T wave inversions, normal intervals  RADIOLOGY I have reviewed the CT personally interpreted patient has positive hydro from kidney stone  PROCEDURES:  Critical Care performed: Yes, see critical care procedure note(s)  .Critical Care  Performed by: Lubertha Rush, MD Authorized by: Lubertha Rush, MD   Critical care provider statement:    Critical care time (minutes):  30   Critical care was necessary to treat or prevent imminent or life-threatening deterioration of the following conditions:  Sepsis   Critical care was time spent personally by me on the following activities:  Development of treatment plan with patient or surrogate, discussions with consultants, evaluation of patient's response to treatment, examination of patient, ordering and review of laboratory studies, ordering and review of radiographic studies, ordering and performing treatments and interventions, pulse oximetry, re-evaluation of patient's condition and review of old charts    MEDICATIONS ORDERED IN ED: Medications  sodium chloride  0.9 % bolus 1,000 mL (0 mLs Intravenous Stopped 11/21/23 1706)  cefTRIAXone  (ROCEPHIN ) 2 g in sodium chloride  0.9 % 100 mL IVPB (has no administration in time range)  0.9 %  sodium chloride  infusion (has no administration in time range)   aspirin chewable tablet 81 mg (has no administration in time range)  atorvastatin (LIPITOR) tablet 40 mg (has no administration in time range)  hydrALAZINE (APRESOLINE) injection 5 mg (has no administration in time range)  acidophilus (RISAQUAD) capsule 1 capsule (has no administration in time range)  fluticasone  (FLONASE ) 50 MCG/ACT nasal spray 2 spray (has no administration in time range)  insulin aspart (novoLOG) injection 0-9 Units (has no administration in time range)  enoxaparin (LOVENOX) injection 40 mg (has no administration in time range)  ondansetron  (ZOFRAN ) tablet 4 mg (has no administration in time range)    Or  ondansetron  (ZOFRAN ) injection 4 mg (has no administration in time range)  HYDROmorphone (DILAUDID) injection 0.5-1 mg (has no administration in time range)  acetaminophen  (TYLENOL ) tablet 650 mg (has no administration in time range)  albuterol  (PROVENTIL ) (2.5 MG/3ML) 0.083% nebulizer solution 2.5 mg (has no administration in time range)  HYDROmorphone (DILAUDID) injection 0.5 mg (0.5 mg Intravenous Given 11/21/23 1506)  ondansetron  (ZOFRAN ) injection 4 mg (4 mg Intravenous Given 11/21/23 1503)  iohexol (OMNIPAQUE) 300 MG/ML solution 100 mL (100 mLs Intravenous Contrast Given 11/21/23 1518)  lactated ringers  bolus 1,000 mL (1,000 mLs Intravenous New Bag/Given 11/21/23 1554)  lactated ringers  bolus 1,000 mL (1,000 mLs Intravenous New Bag/Given 11/21/23 1554)  cefTRIAXone  (ROCEPHIN ) 2 g in sodium chloride  0.9 % 100 mL IVPB (0 g Intravenous Stopped 11/21/23 1629)  acetaminophen  (TYLENOL ) tablet 1,000 mg (1,000 mg Oral Given 11/21/23 1632)     IMPRESSION / MDM / ASSESSMENT AND PLAN / ED COURSE  I reviewed the triage vital signs and the nursing notes.   Patient's presentation is most consistent with acute presentation with potential threat to life or bodily function.  Patient comes in with concerns for left flank pain initially afebrile but patient does report taking naproxen  prior  to coming in.  His urine looks consistent with UTI and I will proceed with CT imaging to evaluate for kidney stone, Pilo, abscess.  Patient was given some fluids.  Started on ceftriaxone  due to concerns for UTI.  He denies any recent antibiotics, recent instrumentation, history of urine cultures that have been drug-resistant.  He does report a prior stent in the past secondary to a large kidney stone but he denies any history of having sepsis or fevers.  Patient given a dose of Dilaudid, Zofran  for symptoms  Troponin is negative.  Urine with large amount of WBCs, RBCs positive  nitrites.  Patient's BMP does show elevated creatinine at 1.9.  Last check was back in 2019 and was 1.1.  Patient's white count is elevated at 18.9  Patient meets sepsis criteria as lactate is elevated patient was given full 30 cc/kg fluid resuscitation.  A repeat temperature showed a temperature of 101.4 so patient given Tylenol .  CT imaging does confirm kidney stone on the left I have discussed the case with Dr.wrenn from urology and they are waiting for the OR to become open.  He was in agreement with the ceftriaxone  given the above.  Urine culture is pending.  I reevaluated patient after 1 L of fluid he is got no swelling in his legs no change in shortness of breath.  Heart rates remained stable and blood pressures remained stable.  His lactate is downtrending from 3.4 to 3.   I have discussed with the hospital team for admission.    The patient is on the cardiac monitor to evaluate for evidence of arrhythmia and/or significant heart rate changes.      FINAL CLINICAL IMPRESSION(S) / ED DIAGNOSES   Final diagnoses:  Sepsis, due to unspecified organism, unspecified whether acute organ dysfunction present (HCC)  AKI (acute kidney injury) (HCC)  Kidney stone  Acute cystitis without hematuria     Rx / DC Orders   ED Discharge Orders     None        Note:  This document was prepared using Dragon voice  recognition software and may include unintentional dictation errors.   Lubertha Rush, MD 11/21/23 [phone]

## 2023-11-21 NOTE — Transfer of Care (Signed)
 Immediate Anesthesia Transfer of Care Note  Patient: Phillip HENSHAW Sr.  Procedure(s) Performed: CYSTOSCOPY, WITH RETROGRADE PYELOGRAM AND URETERAL STENT INSERTION (Left: Ureter)  Patient Location: PACU  Anesthesia Type:General  Level of Consciousness: drowsy  Airway & Oxygen Therapy: Patient Spontanous Breathing and Patient connected to face mask oxygen  Post-op Assessment: Report given to RN, Post -op Vital signs reviewed and stable, and Patient moving all extremities  Post vital signs: Reviewed and stable  Last Vitals:  Vitals Value Taken Time  BP    Temp 36.6 C 11/21/23 1907  Pulse 85 11/21/23 1912  Resp 22 11/21/23 1912  SpO2 99 % 11/21/23 1912  Vitals shown include unfiled device data.  Last Pain:  Vitals:   11/21/23 1632  TempSrc:   PainSc: 3          Complications: No notable events documented.

## 2023-11-21 NOTE — Consult Note (Signed)
 Urology Consult   Physician requesting consult: Dr. Jeane Miguel, MD  Reason for consult: Left ureteral stone with sepsis  History of Present Illness: Phillip MASSENA Sr. is a 73 y.o. with a history of urolithiasis presented to the ED today with complaints of left-sided flank pain over the past 36 hours associate with dysuria.  He also had fevers or chills at home and found to be febrile 101.4 in the ED.  CT A/P 11/21/2023 revealed left ureteral stone 4 mm as well as bilateral nonobstructing stones up to 1 cm.  He was found to have leukocytosis of 18.9, creatinine 1.9 from baseline 1.0.  He has prior history of urolithiasis with prior medical expulsive therapy as well as ureteroscopy in the past.  He has seen urologist prior however none recent.  He has not yet been established with [facility] urology.  Past Medical History:  Diagnosis Date   Complication of anesthesia    SLIGHTLY hard to wake up   Diabetes mellitus, type 2 (HCC)    GERD (gastroesophageal reflux disease)    occ-no meds   History of kidney stones    h/o   Hypertension     Past Surgical History:  Procedure Laterality Date   CATARACT EXTRACTION W/PHACO Left 09/10/2020   Procedure: CATARACT EXTRACTION PHACO AND INTRAOCULAR LENS PLACEMENT (IOC) LEFT DIABETIC 4.20 00:32.2;  Surgeon: Clair Crews, MD;  Location: [facility];  Service: Ophthalmology;  Laterality: Left;  Diabetic - oral meds   CHOLECYSTECTOMY     COLONOSCOPY WITH PROPOFOL  N/A 04/22/2023   Procedure: COLONOSCOPY WITH PROPOFOL ;  Surgeon: Selena Daily, MD;  Location: [facility] END[facility];  Service: Gastroenterology;  Laterality: N/A;   PATELLAR TENDON REPAIR Right 01/06/2018   Procedure: RIGHT KNEE PREPATELLAR BURSA EXCISION WITH ANTERIOR PATELLA SPUR EXCISION;  Surgeon: Molli Angelucci, MD;  Location: [facility] ORS;  Service: Orthopedics;  Laterality: Right;   WRIST SURGERY Left     Current Hospital Medications:  Home Meds:  No current facility-administered  medications on file prior to encounter.   Current Outpatient Medications on File Prior to Encounter  Medication Sig Dispense Refill   amLODipine (NORVASC) 2.5 MG tablet Take 2.5 mg by mouth daily.     atorvastatin (LIPITOR) 40 MG tablet Take 40 mg by mouth at bedtime.     losartan (COZAAR) 100 MG tablet Take 100 mg by mouth daily.     magnesium oxide (MAG-OX) 400 MG tablet Take 1 tablet by mouth 2 (two) times daily.     metFORMIN (GLUCOPHAGE) 500 MG tablet Take 1,000 mg by mouth 2 (two) times daily with a meal.     Spacer/Aero-Holding Chambers (AEROCHAMBER MV) inhaler Use as instructed 1 each 1     Scheduled Meds:  [START ON 11/22/2023] acidophilus  1 capsule Oral Daily   aspirin  81 mg Oral Daily   atorvastatin  40 mg Oral QHS   enoxaparin (LOVENOX) injection  40 mg Subcutaneous Q24H   [START ON 11/22/2023] fluticasone   2 spray Each Nare Daily   [START ON 11/22/2023] insulin aspart  0-9 Units Subcutaneous TID WC   Continuous Infusions:  sodium chloride      [START ON 11/22/2023] cefTRIAXone  (ROCEPHIN )  IV     sodium chloride  Stopped (11/21/23 1706)   PRN Meds:.acetaminophen , albuterol , hydrALAZINE, HYDROmorphone (DILAUDID) injection, ondansetron  **OR** ondansetron  (ZOFRAN ) IV, sodium chloride  irrigation  Allergies:  Allergies  Allergen Reactions   Codeine Shortness Of Breath    "jitters and jerks"   Darvon [Propoxyphene] Shortness Of Breath  Corn Syrup [Glucose] Other (See Comments)    Gout attack   Shellfish Allergy     Aggravates gout    History reviewed. No pertinent family history.  Social History:  reports that he has never smoked. He has never used smokeless tobacco. He reports that he does not drink alcohol and does not use drugs.  ROS: A complete review of systems was performed.  All systems are negative except for pertinent findings as noted.  Physical Exam:  Vital signs in last 24 hours: Temp:  [97.5 F (36.4 C)-101.4 F (38.6 C)] 101.4 F (38.6 C) (05/04  1631) Pulse Rate:  [91] 91 (05/04 1339) Resp:  [17] 17 (05/04 1339) BP: (158)/(83) 158/83 (05/04 1339) SpO2:  [97 %] 97 % (05/04 1339) Weight:  [90.7 kg] 90.7 kg (05/04 1340) Constitutional:  Alert and oriented, No acute distress Cardiovascular: Regular rate and rhythm Respiratory: Normal respiratory effort, Lungs clear bilaterally GI: Abdomen is soft, nontender, nondistended, no abdominal masses GU: Left CVA tenderness Neurologic: Grossly intact, no focal deficits Psychiatric: Normal mood and affect  Laboratory Data:  Recent Labs    11/21/23 1342  WBC 18.9*  HGB 13.8  HCT 42.3  PLT 320    Recent Labs    11/21/23 1342  NA 134*  K 4.1  CL 101  GLUCOSE 208*  BUN 18  CALCIUM 8.7*  CREATININE 1.90*     Results for orders placed or performed during the hospital encounter of 11/21/23 (from the past 24 hours)  Urinalysis, Routine w reflex microscopic -Urine, Clean Catch     Status: Abnormal   Collection Time: 11/21/23  1:42 PM  Result Value Ref Range   Color, Urine YELLOW (A) YELLOW   APPearance HAZY (A) CLEAR   Specific Gravity, Urine 1.019 1.005 - 1.030   pH 6.0 5.0 - 8.0   Glucose, UA 150 (A) NEGATIVE mg/dL   Hgb urine dipstick MODERATE (A) NEGATIVE   Bilirubin Urine NEGATIVE NEGATIVE   Ketones, ur NEGATIVE NEGATIVE mg/dL   Protein, ur 30 (A) NEGATIVE mg/dL   Nitrite POSITIVE (A) NEGATIVE   Leukocytes,Ua LARGE (A) NEGATIVE   RBC / HPF 21-50 0 - 5 RBC/hpf   WBC, UA >50 0 - 5 WBC/hpf   Bacteria, UA RARE (A) NONE SEEN   Squamous Epithelial / HPF 0-5 0 - 5 /HPF  Basic metabolic panel     Status: Abnormal   Collection Time: 11/21/23  1:42 PM  Result Value Ref Range   Sodium 134 (L) 135 - 145 mmol/L   Potassium 4.1 3.5 - 5.1 mmol/L   Chloride 101 98 - 111 mmol/L   CO2 21 (L) 22 - 32 mmol/L   Glucose, Bld 208 (H) 70 - 99 mg/dL   BUN 18 8 - 23 mg/dL   Creatinine, Ser 1.61 (H) 0.61 - 1.24 mg/dL   Calcium 8.7 (L) 8.9 - 10.3 mg/dL   GFR, Estimated 37 (L) >60 mL/min    Anion gap 12 5 - 15  CBC     Status: Abnormal   Collection Time: 11/21/23  1:42 PM  Result Value Ref Range   WBC 18.9 (H) 4.0 - 10.5 K/uL   RBC 4.47 4.22 - 5.81 MIL/uL   Hemoglobin 13.8 13.0 - 17.0 g/dL   HCT 09.6 04.5 - 40.9 %   MCV 94.6 80.0 - 100.0 fL   MCH 30.9 26.0 - 34.0 pg   MCHC 32.6 30.0 - 36.0 g/dL   RDW 81.1 91.4 - 78.2 %  Platelets 320 150 - 400 K/uL   nRBC 0.0 0.0 - 0.2 %  Hepatic function panel     Status: Abnormal   Collection Time: 11/21/23  1:42 PM  Result Value Ref Range   Total Protein 7.2 6.5 - 8.1 g/dL   Albumin 3.5 3.5 - 5.0 g/dL   AST 22 15 - 41 U/L   ALT 31 0 - 44 U/L   Alkaline Phosphatase 82 38 - 126 U/L   Total Bilirubin 1.1 0.0 - 1.2 mg/dL   Bilirubin, Direct 0.3 (H) 0.0 - 0.2 mg/dL   Indirect Bilirubin 0.8 0.3 - 0.9 mg/dL  Lipase, blood     Status: None   Collection Time: 11/21/23  1:42 PM  Result Value Ref Range   Lipase 27 11 - 51 U/L  Troponin I (High Sensitivity)     Status: None   Collection Time: 11/21/23  1:42 PM  Result Value Ref Range   Troponin I (High Sensitivity) 10 <18 ng/L  Lactic acid, plasma     Status: Abnormal   Collection Time: 11/21/23  3:02 PM  Result Value Ref Range   Lactic Acid, Venous 3.4 (HH) 0.5 - 1.9 mmol/L  Troponin I (High Sensitivity)     Status: None   Collection Time: 11/21/23  3:45 PM  Result Value Ref Range   Troponin I (High Sensitivity) 10 <18 ng/L  Lactic acid, plasma     Status: Abnormal   Collection Time: 11/21/23  4:35 PM  Result Value Ref Range   Lactic Acid, Venous 3.0 (HH) 0.5 - 1.9 mmol/L   No results found for this or any previous visit (from the past 240 hours).  Renal Function: Recent Labs    11/21/23 1342  CREATININE 1.90*   Estimated Creatinine Clearance: 38.3 mL/min (A) (by C-G formula based on SCr of 1.9 mg/dL (H)).  Radiologic Imaging: DG OR UROLOGY CYSTO IMAGE ([facility] ONLY) Result Date: 11/21/2023 There is no interpretation for this exam.  This order is for images obtained  during a surgical procedure.  Please See "Surgeries" Tab for more information regarding the procedure.   CT ABDOMEN PELVIS W CONTRAST Result Date: 11/21/2023 CLINICAL DATA:  Acute abdominal and left-sided flank pain. Nephrolithiasis. EXAM: CT ABDOMEN AND PELVIS WITH CONTRAST TECHNIQUE: Multidetector CT imaging of the abdomen and pelvis was performed using the standard protocol following bolus administration of intravenous contrast. RADIATION DOSE REDUCTION: This exam was performed according to the departmental dose-optimization program which includes automated exposure control, adjustment of the mA and/or kV according to patient size and/or use of iterative reconstruction technique. CONTRAST:  100mL OMNIPAQUE IOHEXOL 300 MG/ML  SOLN COMPARISON:  Noncontrast CT on 09/02/2016 FINDINGS: Lower Chest: No acute findings. Hepatobiliary: No suspicious hepatic masses identified. Prior cholecystectomy. No evidence of biliary obstruction. Pancreas:  No mass or inflammatory changes. Spleen: Within normal limits in size and appearance. Adrenals/Urinary Tract: No suspicious masses identified. Several small less than 1 cm renal calculi are again seen bilaterally. Mild left hydroureteronephrosis and perinephric stranding is seen due to a 4 mm distal left ureteral calculus. Stomach/Bowel: No evidence of obstruction, inflammatory process or abnormal fluid collections. Normal appendix visualized. Diverticulosis is seen mainly involving the descending and sigmoid colon, however there is no evidence of diverticulitis. Vascular/Lymphatic: No pathologically enlarged lymph nodes. No acute vascular findings. Reproductive:  Stable mildly enlarged prostate. Other:  None. Musculoskeletal:  No suspicious bone lesions identified. IMPRESSION: Mild left hydroureteronephrosis and perinephric stranding due to 4 mm distal left ureteral calculus. Bilateral  nephrolithiasis. Colonic diverticulosis, without radiographic evidence of diverticulitis.  Stable mildly enlarged prostate. Electronically Signed   By: Marlyce Sine M.D.   On: 11/21/2023 16:06    I independently reviewed the above imaging studies.  Impression/Recommendation Sepsis due to obstructing left ureteral stone - CT A/P with 4 mm distal left ureteral stone.  Febrile to 101.4 with leukocytosis of 18.9.  Urine culture pending. - Will take to the operating room for stent placement. - Will need follow-up for definitive treatment of stone outpatient.  -The risks, benefits and alternatives of cystoscopy with left JJ stent placement was discussed with the patient.  Risks include, but are not limited to: bleeding, urinary tract infection, ureteral injury, ureteral stricture disease, chronic pain, urinary symptoms, bladder injury, stent migration, the need for nephrostomy tube placement, MI, CVA, DVT, PE and the inherent risks with general anesthesia.  The patient voices understanding and wishes to proceed.   Matt R. Aryssa Rosamond MD 11/21/2023, 6:32 PM  [facility]  Pager: [phone]

## 2023-11-21 NOTE — Op Note (Signed)
 Operative Note  Preoperative diagnosis:  1.  Left ureteral stone with sepsis from urologic source  Postoperative diagnosis: 1.  Same  Procedure(s): 1.  Cystoscopy 2. Left retrograde pyelogram with interpretation 3. Left ureteral stent placement 4. Fluoroscopy <1 hour with intraoperative interpretation  Surgeon: Doy Gene, MD  Assistants:  None  Anesthesia:  General  Complications:  None  EBL:  Minimal  Specimens: ID Type Source Tests Collected by Time Destination  A : Left Renal Pelvis Urine Culture Urine Urine, Catheterized URINE CULTURE Lahoma Pigg, MD 11/21/2023 1854     Drains/Catheters: 1.  Left 6Fr x 26cm ureteral stent 2. Foley catheter 16 French  Intraoperative findings:   Cystoscopy demonstrated no suspicious lesions, masses, stones. 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 Left retrograde pyelogram demonstrated mild left hydronephrosis. Successful left ureteral stent placement with curl in the renal pelvis and bladder respectively.  Indication:  Phillip HOWORTH Sr. is a 73 y.o. male with a history of urolithiasis was found to have obstructing 4 mm distal left ureteral stone with sepsis.  After reviewing the management options for treatment, he elected to proceed with the above surgical procedure(s). We have discussed the potential benefits and risks of the procedure, side effects of the proposed treatment, the likelihood of the patient achieving the goals of the procedure, and any potential problems that might occur during the procedure or recuperation. Informed consent has been obtained.  Description of procedure: The patient was taken to the operating room and general anesthesia was induced.  The patient was placed in the dorsal lithotomy position, prepped and draped in the usual sterile fashion, and preoperative antibiotics were administered. A preoperative time-out was performed.    Cystourethroscopy was performed.  The patient's urethra was examined and revealed several bulbar urethral strictures. There was some bilobar prostatic hypertrophy. The bladder was then systematically examined in its entirety. There was no evidence for any bladder tumors, stones, or other mucosal pathology.    Attention then turned to the left ureteral orifice. A 0.038 zip wire was passed through the left orifice and over the wire a 5 Fr open ended catheter was inserted and passed up to the level of the renal pelvis. Aspirate was obtained and sent off as left renal pelvis urine for culture. Omnipaque contrast was injected through the ureteral catheter and a retrograde pyelogram was performed with findings as dictated above. The wire was then replaced and the open ended catheter was removed.   A 6Fr x 26cm ureteral stent was advance over the wire. The stent was positioned appropriately under fluoroscopic and cystoscopic guidance.  The wire was then removed with an adequate stent curl noted in the renal pelvis as well as in the bladder.  The bladder was then emptied, foley catheter placed and the procedure ended.  The patient appeared to tolerate the procedure well and without complications.  The patient was able to be awakened and transferred to the recovery unit in satisfactory condition.   Matt R. Tanise Russman MD [facility]  Pager: [phone]

## 2023-11-21 NOTE — Anesthesia Preprocedure Evaluation (Addendum)
 Anesthesia Evaluation  Patient identified by MRN, date of birth, ID band Patient awake    Reviewed: Allergy & Precautions, NPO status , Patient's Chart, lab work & pertinent test results  History of Anesthesia Complications (+) PROLONGED EMERGENCE and history of anesthetic complications  Airway Mallampati: III  TM Distance: <3 FB Neck ROM: full    Dental  (+) Chipped, Poor Dentition, Missing, Loose   Pulmonary neg shortness of breath, sleep apnea , neg COPD   Pulmonary exam normal        Cardiovascular Exercise Tolerance: Good hypertension, (-) angina (-) Past MI and (-) DOE Normal cardiovascular exam     Neuro/Psych negative neurological ROS  negative psych ROS   GI/Hepatic Neg liver ROS,GERD  Controlled,,  Endo/Other  diabetes, Type 2    Renal/GU Renal disease     Musculoskeletal   Abdominal   Peds  Hematology negative hematology ROS (+)   Anesthesia Other Findings Past Medical History: No date: Complication of anesthesia     Comment:  SLIGHTLY hard to wake up No date: Diabetes mellitus, type 2 (HCC) No date: GERD (gastroesophageal reflux disease)     Comment:  occ-no meds No date: History of kidney stones     Comment:  h/o No date: Hypertension  Past Surgical History: 09/10/2020: CATARACT EXTRACTION W/PHACO; Left     Comment:  Procedure: CATARACT EXTRACTION PHACO AND INTRAOCULAR               LENS PLACEMENT (IOC) LEFT DIABETIC 4.20 00:32.2;                Surgeon: Clair Crews, MD;  Location: [facility]              [facility];  Service: Ophthalmology;  Laterality: Left;                Diabetic - oral meds No date: CHOLECYSTECTOMY 04/22/2023: COLONOSCOPY WITH PROPOFOL ; N/A     Comment:  Procedure: COLONOSCOPY WITH PROPOFOL ;  Surgeon: Selena Daily, MD;  Location: [facility] ENDOSCOPY;  Service:               Gastroenterology;  Laterality: N/A; 01/06/2018: PATELLAR TENDON REPAIR; Right      Comment:  Procedure: RIGHT KNEE PREPATELLAR BURSA EXCISION WITH               ANTERIOR PATELLA SPUR EXCISION;  Surgeon: Molli Angelucci,               MD;  Location: [facility] ORS;  Service: Orthopedics;                Laterality: Right; No date: WRIST SURGERY; Left  BMI    Body Mass Index: 29.11 kg/m      Reproductive/Obstetrics negative OB ROS                             Anesthesia Physical Anesthesia Plan  ASA: 3  Anesthesia Plan: General LMA   Post-op Pain Management:    Induction: Intravenous  PONV Risk Score and Plan: Dexamethasone , Ondansetron , Midazolam  and Treatment may vary due to age or medical condition  Airway Management Planned: LMA  Additional Equipment:   Intra-op Plan:   Post-operative Plan: Extubation in OR  Informed Consent: I have reviewed the patients History and Physical, chart, labs and discussed the procedure including the risks, benefits and alternatives for  the proposed anesthesia with the patient or authorized representative who has indicated his/her understanding and acceptance.     Dental Advisory Given  Plan Discussed with: Anesthesiologist, CRNA and Surgeon  Anesthesia Plan Comments: (Patient consented for risks of anesthesia including but not limited to:  - adverse reactions to medications - damage to eyes, teeth, lips or other oral mucosa - nerve damage due to positioning  - sore throat or hoarseness - Damage to heart, brain, nerves, lungs, other parts of body or loss of life  Patient voiced understanding and assent.)       Anesthesia Quick Evaluation

## 2023-11-21 NOTE — Progress Notes (Signed)
 Elink following for sepsis protocol.

## 2023-11-21 NOTE — Anesthesia Procedure Notes (Signed)
 Procedure Name: LMA Insertion Date/Time: 11/21/2023 6:42 PM  Performed by: Bobette Burrs, CRNAPre-anesthesia Checklist: Patient identified, Emergency Drugs available, Suction available and Patient being monitored Patient Re-evaluated:Patient Re-evaluated prior to induction Oxygen Delivery Method: Circle system utilized Preoxygenation: Pre-oxygenation with 100% oxygen Induction Type: IV induction LMA: LMA inserted LMA Size: 4.0 Number of attempts: 1 Placement Confirmation: positive ETCO2 and breath sounds checked- equal and bilateral Tube secured with: Tape Dental Injury: Teeth and Oropharynx as per pre-operative assessment

## 2023-11-22 ENCOUNTER — Encounter: Payer: Self-pay | Admitting: Urology

## 2023-11-22 ENCOUNTER — Other Ambulatory Visit: Payer: Self-pay | Admitting: Physician Assistant

## 2023-11-22 DIAGNOSIS — B957 Other staphylococcus as the cause of diseases classified elsewhere: Secondary | ICD-10-CM

## 2023-11-22 DIAGNOSIS — E119 Type 2 diabetes mellitus without complications: Secondary | ICD-10-CM

## 2023-11-22 DIAGNOSIS — R7881 Bacteremia: Secondary | ICD-10-CM

## 2023-11-22 DIAGNOSIS — N201 Calculus of ureter: Secondary | ICD-10-CM

## 2023-11-22 DIAGNOSIS — N132 Hydronephrosis with renal and ureteral calculous obstruction: Secondary | ICD-10-CM

## 2023-11-22 DIAGNOSIS — N390 Urinary tract infection, site not specified: Secondary | ICD-10-CM | POA: Diagnosis not present

## 2023-11-22 DIAGNOSIS — J449 Chronic obstructive pulmonary disease, unspecified: Secondary | ICD-10-CM

## 2023-11-22 DIAGNOSIS — A419 Sepsis, unspecified organism: Secondary | ICD-10-CM | POA: Diagnosis not present

## 2023-11-22 DIAGNOSIS — N179 Acute kidney failure, unspecified: Secondary | ICD-10-CM | POA: Diagnosis not present

## 2023-11-22 DIAGNOSIS — I10 Essential (primary) hypertension: Secondary | ICD-10-CM

## 2023-11-22 LAB — LACTIC ACID, PLASMA: Lactic Acid, Venous: 2.4 mmol/L (ref 0.5–1.9)

## 2023-11-22 LAB — BLOOD CULTURE ID PANEL (REFLEXED) - BCID2

## 2023-11-22 LAB — CBC
HCT: 35.6 % — ABNORMAL LOW (ref 39.0–52.0)
Hemoglobin: 11.7 g/dL — ABNORMAL LOW (ref 13.0–17.0)
MCH: 31.1 pg (ref 26.0–34.0)
MCHC: 32.9 g/dL (ref 30.0–36.0)
MCV: 94.7 fL (ref 80.0–100.0)
Platelets: 277 10*3/uL (ref 150–400)
RBC: 3.76 MIL/uL — ABNORMAL LOW (ref 4.22–5.81)
RDW: 13.8 % (ref 11.5–15.5)
WBC: 18 10*3/uL — ABNORMAL HIGH (ref 4.0–10.5)
nRBC: 0 % (ref 0.0–0.2)

## 2023-11-22 LAB — BASIC METABOLIC PANEL WITH GFR
Anion gap: 11 (ref 5–15)
BUN: 22 mg/dL (ref 8–23)
CO2: 22 mmol/L (ref 22–32)
Calcium: 8.1 mg/dL — ABNORMAL LOW (ref 8.9–10.3)
Chloride: 102 mmol/L (ref 98–111)
Creatinine, Ser: 1.79 mg/dL — ABNORMAL HIGH (ref 0.61–1.24)
GFR, Estimated: 39 mL/min — ABNORMAL LOW (ref >60)
Glucose, Bld: 281 mg/dL — ABNORMAL HIGH (ref 70–99)
Potassium: 5 mmol/L (ref 3.5–5.1)
Sodium: 135 mmol/L (ref 135–145)

## 2023-11-22 LAB — GLUCOSE, CAPILLARY
Glucose-Capillary: 140 mg/dL — ABNORMAL HIGH (ref 70–99)
Glucose-Capillary: 184 mg/dL — ABNORMAL HIGH (ref 70–99)
Glucose-Capillary: 194 mg/dL — ABNORMAL HIGH (ref 70–99)
Glucose-Capillary: 148 mg/dL — ABNORMAL HIGH (ref 70–99)

## 2023-11-22 MED ORDER — CEFAZOLIN SODIUM-DEXTROSE 2-4 GM/100ML-% IV SOLN
2.0000 g | Freq: Three times a day (TID) | INTRAVENOUS | Status: DC
  Administered 2023-11-22 – 2023-11-24 (×6): 2 g via INTRAVENOUS
  Filled 2023-11-22 (×8): qty 100

## 2023-11-22 MED ORDER — LACTATED RINGERS IV SOLN: INTRAVENOUS | Status: AC

## 2023-11-22 NOTE — Progress Notes (Signed)
 Progress Note   Patient: Phillip Galloway. Phillip Galloway:[id number] DOB: 1950-09-08 DOA: 11/21/2023     1 DOS: the patient was seen and examined on 11/22/2023   Brief hospital course: Taken from H&P.   [name]. is a 73 y.o. male with medical history significant of kidney stones HTN, HLD,IIDM, COPD, presented with sudden onset of left-sided flank pain and trouble to urinate.  Patient has an history of kidney stone", for he had a similar symptoms 8 to 9 years ago and when he was treated with ureter stent and antibiotics.   On presentation febrile at 101.4, labs with leukocytosis at 18.9, BUN 18, creatinine 1.9 with baseline of 1, bicarb 21.  Lactic acid 3.4 CT abdomen and pelvis positive for obstructing left ureteral stone.  Patient received IV fluid per sepsis protocol and started on ceftriaxone . Urology was consulted.  5/5: Vitals with low-grade fever, s/p cystoscopy and left ureteral stent placement by urology.  Labs with leukocytosis at 18, creatinine with some improvement to 1.79, blood cultures Staph epidermidis.  Patient work with metal scrapes and air conditioning.  History of frequent carts, most recent was about 1 to 2 weeks ago when he had a cut in his hand with rusty metal, wound has been healed. Urine cultures pending Foley to be removed after 24-hour of being afebrile per urology  Assessment and Plan: * Severe sepsis (HCC) Staph epidermidis bacteremia. Patient met severe sepsis criteria with fever, leukocytosis, AKI and lactic acidosis.  Secondary to ureteral stone with hydronephrosis and complicated UTI. 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 Urine cultures pending -ID consult -Ceftriaxone  switched with cefazolin  -Continue with IV fluid -Supportive care -Patient to follow-up with  outpatient urology for definitive treatment  AKI (acute kidney injury) (HCC) Likely secondary to ureteral obstruction.  Creatinine started improving.  No recent baseline available. - Continue with IV fluid -Monitor renal function -Avoid nephrotoxins  Hypertension Blood pressure currently within goal. - Holding home losartan due to AKI -As needed hydralazine  Diabetes mellitus, type 2 (HCC) Holding home metformin -SSI  COPD (chronic obstructive pulmonary disease) (HCC) No concern of exacerbation -Continue home bronchodilator   Subjective: Patient was sitting in chair when seen today.  Pain has been improved.  No new concern.  Foley in place  Physical Exam: Vitals:   11/21/23 2122 11/21/23 2229 11/22/23 0212 11/22/23 1309  BP: 119/69 114/69 (!) 103/59 126/77  Pulse: 84 77 72 68  Resp: 18 18 18    Temp: 99.1 F (37.3 C) 98.4 F (36.9 C) 99.9 F (37.7 C) 98.3 F (36.8 C)  TempSrc: Oral Oral Oral Oral  SpO2: 95% 94% 93% 98%  Weight:      Height:       General.  Well-developed elderly man, in no acute distress. Pulmonary.  Lungs clear bilaterally, normal respiratory effort. CV.  Regular rate and rhythm, no JVD, rub or murmur. Abdomen.  Soft, nontender, nondistended, BS positive. CNS.  Alert and oriented .  No focal neurologic deficit. Extremities.  No edema, no cyanosis, pulses intact and symmetrical. Psychiatry.  Judgment and insight appears normal.   Data Reviewed: Prior data reviewed  Family Communication: Discussed with patient  Disposition: Status is: Inpatient Remains inpatient appropriate because: Severity of illness  Planned Discharge Destination: Home  DVT prophylaxis.  Lovenox Time spent: 50 minutes  This record has been created using Conservation officer, historic buildings. Errors have been sought and corrected,but may not always be located. Such creation errors do not reflect on the standard of care.   Author: Luna Salinas, MD 11/22/2023 4:32 PM  For on  call review [email or web address].

## 2023-11-22 NOTE — Assessment & Plan Note (Signed)
 Blood pressure currently within goal. - Holding home losartan due to AKI -As needed hydralazine

## 2023-11-22 NOTE — Assessment & Plan Note (Signed)
 Likely secondary to ureteral obstruction.  Creatinine started improving.  No recent baseline available. - Continue with IV fluid -Monitor renal function -Avoid nephrotoxins

## 2023-11-22 NOTE — Assessment & Plan Note (Signed)
 Holding home metformin  SSI

## 2023-11-22 NOTE — Progress Notes (Cosign Needed Addendum)
 Urology Inpatient Progress Note  Subjective: No acute events overnight. He is afebrile, VSS. WBC count stable, 18.0. Creatinine down, 1.79. Urine culture pending, blood cultures growing Staph epidermidis; on antibiotics as below. Foley catheter in place draining clear, yellow urine. Today he reports some penile discomfort associated with his Foley but no flank pain.  He reports about a month of hesitancy and weak stream leading up to his current admission, which was responsive to over-the-counter supplements.  He does not have a primary urologist.  Anti-infectives: Anti-infectives (From admission, onward)    Start     Dose/Rate Route Frequency Ordered Stop   11/22/23 1000  cefTRIAXone  (ROCEPHIN ) 2 g in sodium chloride  0.9 % 100 mL IVPB        2 g 200 mL/hr over 30 Minutes Intravenous Every 24 hours 11/21/23 1700     11/21/23 1530  cefTRIAXone  (ROCEPHIN ) 2 g in sodium chloride  0.9 % 100 mL IVPB        2 g 200 mL/hr over 30 Minutes Intravenous  Once 11/21/23 1529 11/21/23 1629       Current Facility-Administered Medications  Medication Dose Route Frequency Provider Last Rate Last Admin   0.9 %  sodium chloride  infusion   Intravenous Continuous Antoniette Batty T, MD 100 mL/hr at 11/22/23 0606 New Bag at 11/22/23 0606   acetaminophen  (TYLENOL ) tablet 650 mg  650 mg Oral Q6H PRN [place], MD       acidophilus (RISAQUAD) capsule 1 capsule  1 capsule Oral Daily Antoniette Batty T, MD       albuterol  (PROVENTIL ) (2.5 MG/3ML) 0.083% nebulizer solution 2.5 mg  2.5 mg Nebulization Q4H PRN Antoniette Batty T, MD       aspirin chewable tablet 81 mg  81 mg Oral Daily Antoniette Batty T, MD       atorvastatin (LIPITOR) tablet 40 mg  40 mg Oral QHS Antoniette Batty T, MD   40 mg at 11/21/23 2147   cefTRIAXone  (ROCEPHIN ) 2 g in sodium chloride  0.9 % 100 mL IVPB  2 g Intravenous Q24H [place], MD       Chlorhexidine  Gluconate Cloth 2 % PADS 6 each  6 each Topical Q0600 [place], MD       enoxaparin (LOVENOX)  injection 40 mg  40 mg Subcutaneous Q24H Antoniette Batty T, MD   40 mg at 11/21/23 2147   fluticasone  (FLONASE ) 50 MCG/ACT nasal spray 2 spray  2 spray Each Nare Daily Antoniette Batty T, MD       hydrALAZINE (APRESOLINE) injection 5 mg  5 mg Intravenous Q6H PRN Antoniette Batty T, MD       HYDROmorphone (DILAUDID) injection 0.5-1 mg  0.5-1 mg Intravenous Q2H PRN Antoniette Batty T, MD       insulin aspart (novoLOG) injection 0-9 Units  0-9 Units Subcutaneous TID WC Antoniette Batty T, MD       ondansetron  (ZOFRAN ) tablet 4 mg  4 mg Oral Q6H PRN Antoniette Batty T, MD       Or   ondansetron  (ZOFRAN ) injection 4 mg  4 mg Intravenous Q6H PRN Antoniette Batty T, MD       sodium chloride  0.9 % bolus 1,000 mL  1,000 mL Intravenous Once Lubertha Rush, MD   Stopped at 11/21/23 1706    Objective: Vital signs in last 24 hours: Temp:  [97.5 F (36.4 C)-101.4 F (38.6 C)] 99.9 F (37.7 C) (05/05 0212) Pulse Rate:  [72-92] 72 (05/05 0212) Resp:  [  17-25] 18 (05/05 0212) BP: (103-158)/(59-83) 103/59 (05/05 0212) SpO2:  [93 %-100 %] 93 % (05/05 0212) Weight:  [90.7 kg] 90.7 kg (05/04 1340)  Intake/Output from previous day: 05/04 0701 - 05/05 0700 In: 874.8 [I.V.:874.8] Out: 1450 [Urine:1450] Intake/Output this shift: No intake/output data recorded.  Physical Exam Vitals and nursing note reviewed.  Constitutional:      General: He is not in acute distress.    Appearance: He is not ill-appearing, toxic-appearing or diaphoretic.  HENT:     Head: Normocephalic and atraumatic.  Pulmonary:     Effort: Pulmonary effort is normal. No respiratory distress.  Skin:    General: Skin is warm and dry.  Neurological:     Mental Status: He is alert and oriented to person, place, and time.  Psychiatric:        Mood and Affect: Mood normal.        Behavior: Behavior normal.     Lab Results:  Recent Labs    11/21/23 1342 11/22/23 0543  WBC 18.9* 18.0*  HGB 13.8 11.7*  HCT 42.3 35.6*  PLT 320 277   BMET Recent Labs     11/21/23 1342 11/22/23 0543  NA 134* 135  K 4.1 5.0  CL 101 102  CO2 21* 22  GLUCOSE 208* 281*  BUN 18 22  CREATININE 1.90* 1.79*  CALCIUM 8.7* 8.1*   PT/INR No results for input(s): "LABPROT", "INR" in the last 72 hours. ABG No results for input(s): "PHART", "HCO3" in the last 72 hours.  Invalid input(s): "PCO2", "PO2"  Studies/Results: DG OR UROLOGY CYSTO IMAGE ([facility] ONLY) Result Date: 11/21/2023 There is no interpretation for this exam.  This order is for images obtained during a surgical procedure.  Please See "Surgeries" Tab for more information regarding the procedure.   CT ABDOMEN PELVIS W CONTRAST Result Date: 11/21/2023 CLINICAL DATA:  Acute abdominal and left-sided flank pain. Nephrolithiasis. EXAM: CT ABDOMEN AND PELVIS WITH CONTRAST TECHNIQUE: Multidetector CT imaging of the abdomen and pelvis was performed using the standard protocol following bolus administration of intravenous contrast. RADIATION DOSE REDUCTION: This exam was performed according to the departmental dose-optimization program which includes automated exposure control, adjustment of the mA and/or kV according to patient size and/or use of iterative reconstruction technique. CONTRAST:  100mL OMNIPAQUE IOHEXOL 300 MG/ML  SOLN COMPARISON:  Noncontrast CT on 09/02/2016 FINDINGS: Lower Chest: No acute findings. Hepatobiliary: No suspicious hepatic masses identified. Prior cholecystectomy. No evidence of biliary obstruction. Pancreas:  No mass or inflammatory changes. Spleen: Within normal limits in size and appearance. Adrenals/Urinary Tract: No suspicious masses identified. Several small less than 1 cm renal calculi are again seen bilaterally. Mild left hydroureteronephrosis and perinephric stranding is seen due to a 4 mm distal left ureteral calculus. Stomach/Bowel: No evidence of obstruction, inflammatory process or abnormal fluid collections. Normal appendix visualized. Diverticulosis is seen mainly involving the  descending and sigmoid colon, however there is no evidence of diverticulitis. Vascular/Lymphatic: No pathologically enlarged lymph nodes. No acute vascular findings. Reproductive:  Stable mildly enlarged prostate. Other:  None. Musculoskeletal:  No suspicious bone lesions identified. IMPRESSION: Mild left hydroureteronephrosis and perinephric stranding due to 4 mm distal left ureteral calculus. Bilateral nephrolithiasis. Colonic diverticulosis, without radiographic evidence of diverticulitis. Stable mildly enlarged prostate. Electronically Signed   By: Marlyce Sine M.D.   On: 11/21/2023 16:06   Assessment & Plan: 73 y.o. male with PMH urolithiasis and diabetes now s/p left ureteral stent placement with Dr. Freddi Jaeger for management of an  obstructing 4 mm distal left ureteral stone with sepsis due to UTI.  He is clinically improving on empiric antibiotics.  He is tolerating his stent well.  Foley can come out once he has been afebrile x 24 hours.  We discussed that he will require 14 days of culture-appropriate antibiotics to treat his infection, followed by outpatient left ureteroscopy with laser lithotripsy and stent exchange in 2-3 weeks to treat his stone. He expressed understanding.  Recommendations: -Continue empiric antibiotics and follow cultures for a total of 14 days of culture-appropriate therapy. -Discontinue Foley catheter once afebrile x 24 hours, anticipate around 1630 this afternoon. - Consider Flomax  0.4mg  daily and oxybutynin 5mg  every 8 hours as needed for stent discomfort. -Outpatient left URS/LL/stent exchange with Dr. Estanislao Heimlich on 12/10/2023 with office visit prior, will arrange  Kathreen Pare, PA-C 11/22/2023

## 2023-11-22 NOTE — Assessment & Plan Note (Addendum)
 Staph epidermidis bacteremia. Patient met severe sepsis criteria with fever, leukocytosis, AKI and lactic acidosis.  Secondary to ureteral stone with hydronephrosis and complicated UTI. 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 Urine cultures pending -ID consult -Ceftriaxone  switched with cefazolin  -Continue with IV fluid -Supportive care -Patient to follow-up with outpatient urology for definitive treatment

## 2023-11-22 NOTE — Assessment & Plan Note (Signed)
No concern of exacerbation. -Continue home bronchodilator

## 2023-11-22 NOTE — Consult Note (Addendum)
 NAME: Phillip SOCARRAS Sr.  DOB: 03-16-1951  MRN: [id number]  Date/Time: 11/22/2023 6:55 PM  REQUESTING PROVIDER: Dr.Amin Subjective:  REASON FOR CONSULT: Phillip Galloway bacteremia ? Phillip THORNBERRY Sr. is a 73 y.o. with a history of Renal stones, stent 30 yrs ago, Diabetes, gout presented with acute onset of left sided back pain with difficulty urinating X 1 day HE came to the ED on 11/21/23- He recognized that it was due to renal stone- the pan started in the back and came to his groin He then could not pass urine He did not have any fever or chills at home In the ED vitals  11/21/23 13:39  BP 158/83 !  Temp 97.5 F (36.4 C) !  Pulse Rate 91  Resp 17  SpO2 97 %    Latest Reference Range & Units 11/21/23 13:42  WBC 4.0 - 10.5 K/uL 18.9 (H)  Hemoglobin 13.0 - 17.0 g/dL 40.9  HCT 81.1 - 91.4 % 42.3  Platelets 150 - 400 K/uL 320  Creatinine 0.61 - 1.24 mg/dL 7.82 (H)   Blood culture and urine culture sent CT abdomen showed b/l renal stones, mld left hydroureteronephrosis and perinephric stranding due to distal left ureteral calculus Pt was started on IV ceftriaxone  He underwent urgent stent placement - urine was aspirated from left kidney and sent for culture I am seeing the patient as culture is positive for staph Galloway bacteremia and UTI Pt works in [employer] and had a cult to hs left thumb 6 weeks ago- this has healed completely  Past Medical History:  Diagnosis Date   Complication of anesthesia    SLIGHTLY hard to wake up   Diabetes mellitus, type 2 (HCC)    GERD (gastroesophageal reflux disease)    occ-no meds   History of kidney stones    h/o   Hypertension     Past Surgical History:  Procedure Laterality Date   CATARACT EXTRACTION W/PHACO Left 09/10/2020   Procedure: CATARACT EXTRACTION PHACO AND INTRAOCULAR LENS PLACEMENT (IOC) LEFT DIABETIC 4.20 00:32.2;  Surgeon: Clair Crews, MD;  Location: [facility];  Service: Ophthalmology;  Laterality: Left;  Diabetic  - oral meds   CHOLECYSTECTOMY     COLONOSCOPY WITH PROPOFOL  N/A 04/22/2023   Procedure: COLONOSCOPY WITH PROPOFOL ;  Surgeon: Selena Daily, MD;  Location: [facility];  Service: Gastroenterology;  Laterality: N/A;   CYSTOSCOPY W/ URETERAL STENT PLACEMENT Left 11/21/2023   Procedure: CYSTOSCOPY, WITH RETROGRADE PYELOGRAM AND URETERAL STENT INSERTION;  Surgeon: Lahoma Pigg, MD;  Location: [facility];  Service: Urology;  Laterality: Left;   PATELLAR TENDON REPAIR Right 01/06/2018   Procedure: RIGHT KNEE PREPATELLAR BURSA EXCISION WITH ANTERIOR PATELLA SPUR EXCISION;  Surgeon: Molli Angelucci, MD;  Location: [facility];  Service: Orthopedics;  Laterality: Right;   WRIST SURGERY Left     Social History   Socioeconomic History   Marital status: Single    Spouse name: Not on file   Number of children: Not on file   Years of education: Not on file   Highest education level: Not on file  Occupational History   Not on file  Tobacco Use   Smoking status: Never   Smokeless tobacco: Never  Vaping Use   Vaping status: Never Used  Substance and Sexual Activity   Alcohol use: No   Drug use: Never   Sexual activity: Not on file  Other Topics Concern   Not on file  Social History Narrative   Not on file  Social Drivers of Corporate investment banker Strain: Not on file  Food Insecurity: No Food Insecurity (11/21/2023)   Hunger Vital Sign    Worried About Running Out of Food in the Last Year: Never true    Ran Out of Food in the Last Year: Never true  Transportation Needs: No Transportation Needs (11/21/2023)   PRAPARE - Administrator, Civil Service (Medical): No    Lack of Transportation (Non-Medical): No  Physical Activity: Not on file  Stress: Not on file  Social Connections: Socially Isolated (11/21/2023)   Social Connection and Isolation Panel [NHANES]    Frequency of Communication with Friends and Family: Never    Frequency of Social Gatherings with Friends and Family:  Never    Attends Religious Services: Never    Database administrator or Organizations: Yes    Attends Banker Meetings: 1 to 4 times per year    Marital Status: Widowed  Intimate Partner Violence: Not At Risk (11/21/2023)   Humiliation, Afraid, Rape, and Kick questionnaire    Fear of Current or Ex-Partner: No    Emotionally Abused: No    Physically Abused: No    Sexually Abused: No    History reviewed. No pertinent family history. Allergies  Allergen Reactions   Codeine Shortness Of Breath    "jitters and jerks"   Darvon [Propoxyphene] Shortness Of Breath   Corn Syrup [Glucose] Other (See Comments)    Gout attack   Shellfish Allergy     Aggravates gout   I? Current Facility-Administered Medications  Medication Dose Route Frequency Provider Last Rate Last Admin   acetaminophen  (TYLENOL ) tablet 650 mg  650 mg Oral Q6H PRN Antoniette Batty T, MD       acidophilus (RISAQUAD) capsule 1 capsule  1 capsule Oral Daily Antoniette Batty T, MD   1 capsule at 11/22/23 1010   albuterol  (PROVENTIL ) (2.5 MG/3ML) 0.083% nebulizer solution 2.5 mg  2.5 mg Nebulization Q4H PRN Antoniette Batty T, MD       aspirin chewable tablet 81 mg  81 mg Oral Daily Antoniette Batty T, MD   81 mg at 11/22/23 1010   atorvastatin (LIPITOR) tablet 40 mg  40 mg Oral QHS Antoniette Batty T, MD   40 mg at 11/21/23 2147   ceFAZolin  (ANCEF ) IVPB 2g/100 mL premix  2 g Intravenous Q8H Adrieanna Boteler, Merrie Abed, MD 200 mL/hr at 11/22/23 1749 2 g at 11/22/23 1749   Chlorhexidine  Gluconate Cloth 2 % PADS 6 each  6 each Topical Q0600 Antoniette Batty T, MD   6 each at 11/22/23 1011   enoxaparin (LOVENOX) injection 40 mg  40 mg Subcutaneous Q24H Antoniette Batty T, MD   40 mg at 11/22/23 1744   fluticasone  (FLONASE ) 50 MCG/ACT nasal spray 2 spray  2 spray Each Nare Daily Antoniette Batty T, MD   2 spray at 11/22/23 1011   hydrALAZINE (APRESOLINE) injection 5 mg  5 mg Intravenous Q6H PRN Antoniette Batty T, MD       HYDROmorphone (DILAUDID) injection 0.5-1 mg   0.5-1 mg Intravenous Q2H PRN Antoniette Batty T, MD       insulin aspart (novoLOG) injection 0-9 Units  0-9 Units Subcutaneous TID WC Zhang, Ping T, MD   2 Units at 11/22/23 1755   lactated ringers  infusion   Intravenous Continuous Amin, Sumayya, MD 100 mL/hr at 11/22/23 1746 New Bag at 11/22/23 1746   ondansetron  (ZOFRAN ) tablet 4 mg  4 mg Oral Q6H PRN  [place], MD       Or   ondansetron  (ZOFRAN ) injection 4 mg  4 mg Intravenous Q6H PRN [place], MD       sodium chloride  0.9 % bolus 1,000 mL  1,000 mL Intravenous Once Lubertha Rush, MD   Stopped at 11/21/23 1706     Abtx:  Anti-infectives (From admission, onward)    Start     Dose/Rate Route Frequency Ordered Stop   11/22/23 1600  ceFAZolin  (ANCEF ) IVPB 2g/100 mL premix        2 g 200 mL/hr over 30 Minutes Intravenous Every 8 hours 11/22/23 1453     11/22/23 1000  cefTRIAXone  (ROCEPHIN ) 2 g in sodium chloride  0.9 % 100 mL IVPB  Status:  Discontinued        2 g 200 mL/hr over 30 Minutes Intravenous Every 24 hours 11/21/23 1700 11/22/23 1453   11/21/23 1530  cefTRIAXone  (ROCEPHIN ) 2 g in sodium chloride  0.9 % 100 mL IVPB        2 g 200 mL/hr over 30 Minutes Intravenous  Once 11/21/23 1529 11/21/23 1629       REVIEW OF SYSTEMS:  Const: negative fever, negative chills, negative weight loss Eyes: negative diplopia or visual changes, negative eye pain ENT: negative coryza, negative sore throat Resp: negative cough, hemoptysis, dyspnea Cards: negative for chest pain, palpitations, lower extremity edema GU: as above GI: Negative for abdominal pain, diarrhea, bleeding, constipation Skin: negative for rash and pruritus Heme: negative for easy bruising and gum/nose bleeding MS: negative for myalgias, arthralgias, back pain and muscle weakness Neurolo:negative for headaches, dizziness, vertigo, memory problems  Psych: negative for feelings of anxiety, depression  Endocrine:  diabetes Allergy/Immunology-as aboves ? Objective:   VITALS:  BP 126/77   Pulse 68   Temp 98.3 F (36.8 C) (Oral)   Resp 18   Ht 5' 9.5" (1.765 m)   Wt 90.7 kg   SpO2 98%   BMI 29.11 kg/m   PHYSICAL EXAM:  General: Alert, cooperative, no distress, appears stated age.  Head: Normocephalic, without obvious abnormality, atraumatic. Eyes: Conjunctivae clear, anicteric sclerae. Pupils are equal ENT Nares normal. No drainage or sinus tenderness. Lips, mucosa, and tongue normal. No Thrush Neck: Supple, symmetrical, no adenopathy, thyroid: non tender no carotid bruit and no JVD. Back: No CVA tenderness. Lungs: Clear to auscultation bilaterally. No Wheezing or Rhonchi. No rales. Heart: Regular rate and rhythm, no murmur, rub or gallop. Abdomen: Soft, non-tender,not distended. Bowel sounds normal. No masses Foley  Extremities: atraumatic, no cyanosis. No edema. No clubbing Skin: No rashes or lesions. Or bruising Lymph: Cervical, supraclavicular normal. Neurologic: Grossly non-focal Pertinent Labs Lab Results CBC    Component Value Date/Time   WBC 18.0 (H) 11/22/2023 0543   RBC 3.76 (L) 11/22/2023 0543   HGB 11.7 (L) 11/22/2023 0543   HCT 35.6 (L) 11/22/2023 0543   PLT 277 11/22/2023 0543   MCV 94.7 11/22/2023 0543   MCH 31.1 11/22/2023 0543   MCHC 32.9 11/22/2023 0543   RDW 13.8 11/22/2023 0543   LYMPHSABS 1.5 10/25/2017 1408   MONOABS 1.1 (H) 10/25/2017 1408   EOSABS 0.2 10/25/2017 1408   BASOSABS 0.1 10/25/2017 1408       Latest Ref Rng & Units 11/22/2023    5:43 AM 11/21/2023    1:42 PM 10/25/2017    2:08 PM  CMP  Glucose 70 - 99 mg/dL 478  295  621   BUN 8 - 23 mg/dL 22  18  14   Creatinine 0.61 - 1.24 mg/dL 1.61  0.96  0.45   Sodium 135 - 145 mmol/L 135  134  137   Potassium 3.5 - 5.1 mmol/L 5.0  4.1  4.1   Chloride 98 - 111 mmol/L 102  101  105   CO2 22 - 32 mmol/L 22  21  26    Calcium 8.9 - 10.3 mg/dL 8.1  8.7  8.6   Total Protein 6.5 - 8.1 g/dL  7.2    Total Bilirubin 0.0 - 1.2 mg/dL  1.1    Alkaline Phos 38 -  126 U/L  82    AST 15 - 41 U/L  22    ALT 0 - 44 U/L  31        Microbiology: Recent Results (from the past 240 hours)  Urine Culture     Status: Abnormal (Preliminary result)   Collection Time: 11/21/23  1:42 PM   Specimen: Urine, Random  Result Value Ref Range Status   Specimen Description   Final    URINE, RANDOM Performed at [facility], [address] [place], [place] [address]    Special Requests   Final    NONE Performed at [facility], [address], [place], [place] [address]    Culture (A)  Final    >=100,000 COLONIES/mL STAPHYLOCOCCUS Galloway SUSCEPTIBILITIES TO FOLLOW Performed at [facility], [address] [place], [place] [address]    Report Status PENDING  Incomplete  Blood culture (routine x 2)     Status: None (Preliminary result)   Collection Time: 11/21/23  2:52 PM   Specimen: BLOOD  Result Value Ref Range Status   Specimen Description   Final    BLOOD LEFT ANTECUBITAL Performed at [facility], [address] [place], [place] [address]    Special Requests   Final    BOTTLES DRAWN AEROBIC AND ANAEROBIC Blood Culture results may not be optimal due to an inadequate volume of blood received in culture bottles Performed at [facility], [address] [place], [place] [address]    Culture  Setup Time   Final    GRAM POSITIVE COCCI IN BOTH AEROBIC AND ANAEROBIC BOTTLES CRITICAL VALUE NOTED.  VALUE IS CONSISTENT WITH PREVIOUSLY REPORTED AND CALLED VALUE. GRAM STAIN REVIEWED-AGREE WITH RESULT DRT Performed at [facility], [address], [place], [place] [address]    Culture GRAM POSITIVE COCCI  Final   Report Status PENDING  Incomplete  Blood culture (routine x 2)     Status: None (Preliminary result)   Collection Time: 11/21/23  2:54 PM   Specimen: BLOOD  Result Value Ref Range Status   Specimen Description   Final    BLOOD RIGHT ANTECUBITAL Performed at [facility], [address] [place], [place] [address]    Special Requests   Final    BOTTLES DRAWN AEROBIC AND ANAEROBIC Blood Culture adequate volume Performed at [facility], [address], [place], [place] [address]    Culture  Setup Time   Final    GRAM POSITIVE COCCI IN BOTH AEROBIC AND ANAEROBIC BOTTLES CRITICAL RESULT CALLED TO, READ BACK BY AND VERIFIED WITH: AMANDA [facility] 11/22/23 @ 0734 BY SH Performed at [facility], [address] [place], [place] [address]    Culture [facility] POSITIVE COCCI  Final   Report Status PENDING  Incomplete  Blood Culture ID Panel (Reflexed)  Status: Abnormal   Collection Time: 11/21/23  2:54 PM  Result Value Ref Range Status   Enterococcus faecalis NOT DETECTED NOT DETECTED Final   Enterococcus Faecium NOT DETECTED NOT DETECTED Final   Listeria monocytogenes NOT DETECTED NOT DETECTED Final   Staphylococcus species DETECTED (A) NOT DETECTED Final    Comment: CRITICAL RESULT CALLED TO, READ BACK BY AND VERIFIED WITH: AMANDA CHOI 11/22/23 @ 0724 BY SH    Staphylococcus aureus (BCID) NOT DETECTED NOT DETECTED Final   Staphylococcus Galloway DETECTED (A) NOT DETECTED Final    Comment: CRITICAL RESULT CALLED TO, READ BACK BY AND VERIFIED WITH: AMANDA CHOI 11/22/23 @ 0724 BY SH    Staphylococcus lugdunensis NOT DETECTED NOT DETECTED Final   Streptococcus species NOT DETECTED NOT DETECTED Final   Streptococcus agalactiae NOT DETECTED NOT DETECTED Final   Streptococcus pneumoniae NOT DETECTED NOT DETECTED Final   Streptococcus pyogenes NOT DETECTED NOT DETECTED Final   A.calcoaceticus-baumannii NOT DETECTED NOT DETECTED Final   Bacteroides fragilis NOT DETECTED NOT DETECTED Final   Enterobacterales NOT DETECTED NOT DETECTED Final   Enterobacter cloacae complex NOT DETECTED NOT DETECTED Final   Escherichia coli NOT DETECTED NOT DETECTED Final   Klebsiella aerogenes NOT DETECTED NOT DETECTED Final   Klebsiella oxytoca NOT DETECTED NOT DETECTED Final    Klebsiella pneumoniae NOT DETECTED NOT DETECTED Final   Proteus species NOT DETECTED NOT DETECTED Final   Salmonella species NOT DETECTED NOT DETECTED Final   Serratia marcescens NOT DETECTED NOT DETECTED Final   Haemophilus influenzae NOT DETECTED NOT DETECTED Final   Neisseria meningitidis NOT DETECTED NOT DETECTED Final   Pseudomonas aeruginosa NOT DETECTED NOT DETECTED Final   Stenotrophomonas maltophilia NOT DETECTED NOT DETECTED Final   Candida albicans NOT DETECTED NOT DETECTED Final   Candida auris NOT DETECTED NOT DETECTED Final   Candida glabrata NOT DETECTED NOT DETECTED Final   Candida krusei NOT DETECTED NOT DETECTED Final   Candida parapsilosis NOT DETECTED NOT DETECTED Final   Candida tropicalis NOT DETECTED NOT DETECTED Final   Cryptococcus neoformans/gattii NOT DETECTED NOT DETECTED Final   Methicillin resistance mecA/C NOT DETECTED NOT DETECTED Final    Comment: Performed at [facility], [address], [place], [place] [address]  Urine Culture     Status: Abnormal (Preliminary result)   Collection Time: 11/21/23  6:54 PM   Specimen: Urine, Catheterized  Result Value Ref Range Status   Specimen Description   Final    URINE, CATHETERIZED Performed at [facility], [address] [place], [place] [address]    Special Requests   Final    NONE Performed at [facility], [address], [place], [place] [address]    Culture (A)  Final    20,000 COLONIES/mL GRAM POSITIVE COCCI IDENTIFICATION AND SUSCEPTIBILITIES TO FOLLOW Performed at [facility], [address], [place], [place] [address]    Report Status PENDING  Incomplete    IMAGING RESULTS: CT abdomen and pelvis Mild left hydroureteronephrosis and perinephric stranding due to 4 mm distal left ureteral calculus. I have personally reviewed the films ? Impression/Recommendation Staphylococcus Galloway bacteremia - high bio burden 2/2 blood set  Urine culture is  positive for same bacteria Is this a primary bacteremia with urine spill or secondary bacteremia from a complicated UTI Likely the latter  Change ceftriaxone  to cefazolin  will repeat blood culture  Complicated UTI due to obstructive uropathy left due to ureteric stone- no has stent Staph Galloway in urine culture  Has multiple renal stones and they could be colonized  AKI improving  DM on metformin  Tetanus toxoid as he gets frequent cuts at his job  ? ? ? I have personally spent  60---minutes involved in face-to-face and non-face-to-face activities for this patient on the day of the visit. Professional time spent includes the following activities: Preparing to see the patient (review of tests), Obtaining and/or reviewing separately obtained history (admission/discharge record), Performing a medically appropriate examination and/or evaluation , Ordering medications/tests/procedures, referring and communicating with other health care professionals, Documenting clinical information in the EMR, Independently interpreting results (not separately reported), Communicating results to the patient/family/caregiver, Counseling and educating the patient/family/caregiver and Care coordination (not separately reported).  This involved complicated antimicrobial management   ________________________________________________ Discussed with patient, requesting provider

## 2023-11-22 NOTE — Progress Notes (Signed)
 PHARMACY - PHYSICIAN COMMUNICATION CRITICAL VALUE ALERT - BLOOD CULTURE IDENTIFICATION (BCID)  Phillip Galloway. is an 73 y.o. male who presented to [facility] on 11/21/2023 with a chief complaint of flank pain related to ureteral stone  Assessment:  blood cultures from 5/4 with GPC in 3 of 4 bottles, BCID detects methicillin susceptible S. Epidermidis.  Blood cultures taken prior to urological procedure in evening of 5/4.  Urine cx pending  Name of physician (or Provider) Contacted: Dr Ariel Begun  Current antibiotics: Ceftriaxone   Changes to prescribed antibiotics recommended:  Recommendations accepted by provider - plan to involve ID  Results for orders placed or performed during the hospital encounter of 11/21/23  Blood Culture ID Panel (Reflexed) (Collected: 11/21/2023  2:54 PM)  Result Value Ref Range   Enterococcus faecalis NOT DETECTED NOT DETECTED   Enterococcus Faecium NOT DETECTED NOT DETECTED   Listeria monocytogenes NOT DETECTED NOT DETECTED   Staphylococcus species DETECTED (A) NOT DETECTED   Staphylococcus aureus (BCID) NOT DETECTED NOT DETECTED   Staphylococcus epidermidis DETECTED (A) NOT DETECTED   Staphylococcus lugdunensis NOT DETECTED NOT DETECTED   Streptococcus species NOT DETECTED NOT DETECTED   Streptococcus agalactiae NOT DETECTED NOT DETECTED   Streptococcus pneumoniae NOT DETECTED NOT DETECTED   Streptococcus pyogenes NOT DETECTED NOT DETECTED   A.calcoaceticus-baumannii NOT DETECTED NOT DETECTED   Bacteroides fragilis NOT DETECTED NOT DETECTED   Enterobacterales NOT DETECTED NOT DETECTED   Enterobacter cloacae complex NOT DETECTED NOT DETECTED   Escherichia coli NOT DETECTED NOT DETECTED   Klebsiella aerogenes NOT DETECTED NOT DETECTED   Klebsiella oxytoca NOT DETECTED NOT DETECTED   Klebsiella pneumoniae NOT DETECTED NOT DETECTED   Proteus species NOT DETECTED NOT DETECTED   Salmonella species NOT DETECTED NOT DETECTED   Serratia marcescens NOT DETECTED NOT  DETECTED   Haemophilus influenzae NOT DETECTED NOT DETECTED   Neisseria meningitidis NOT DETECTED NOT DETECTED   Pseudomonas aeruginosa NOT DETECTED NOT DETECTED   Stenotrophomonas maltophilia NOT DETECTED NOT DETECTED   Candida albicans NOT DETECTED NOT DETECTED   Candida auris NOT DETECTED NOT DETECTED   Candida glabrata NOT DETECTED NOT DETECTED   Candida krusei NOT DETECTED NOT DETECTED   Candida parapsilosis NOT DETECTED NOT DETECTED   Candida tropicalis NOT DETECTED NOT DETECTED   Cryptococcus neoformans/gattii NOT DETECTED NOT DETECTED   Methicillin resistance mecA/C NOT DETECTED NOT DETECTED    Valma Gazella, PharmD, BCPS, BCIDP Work Cell: [phone] 11/22/2023 8:41 AM

## 2023-11-22 NOTE — Hospital Course (Addendum)
 Taken from H&P.   Phillip LUKASZEWICZ Sr. is a 73 y.o. male with medical history significant of kidney stones HTN, HLD,IIDM, COPD, presented with sudden onset of left-sided flank pain and trouble to urinate.  Patient has an history of kidney stone", for he had a similar symptoms 8 to 9 years ago and when he was treated with ureter stent and antibiotics.   On presentation febrile at 101.4, labs with leukocytosis at 18.9, BUN 18, creatinine 1.9 with baseline of 1, bicarb 21.  Lactic acid 3.4 CT abdomen and pelvis positive for obstructing left ureteral stone.  Patient received IV fluid per sepsis protocol and started on ceftriaxone . Urology was consulted.  5/5: Vitals with low-grade fever, s/p cystoscopy and left ureteral stent placement by urology.  Labs with leukocytosis at 18, creatinine with some improvement to 1.79, blood cultures Staph epidermidis.  Patient work with metal scrapes and air conditioning.  History of frequent carts, most recent was about 1 to 2 weeks ago when he had a cut in his hand with rusty metal, wound has been healed. Urine cultures pending Foley to be removed after 24-hour of being afebrile per urology.  5/6: Remained afebrile, urine cultures seems like also growing MSSE, ID is on board and blood cultures were repeated.  Some improvement in leukocytosis and creatinine.  Foley catheter was removed today.

## 2023-11-22 NOTE — Plan of Care (Signed)

## 2023-11-22 NOTE — Anesthesia Postprocedure Evaluation (Signed)
 Anesthesia Post Note  Patient: Phillip NOHR Sr.  Procedure(s) Performed: CYSTOSCOPY, WITH RETROGRADE PYELOGRAM AND URETERAL STENT INSERTION (Left: Ureter)  Patient location during evaluation: PACU Anesthesia Type: General Level of consciousness: awake and alert Pain management: pain level controlled Vital Signs Assessment: post-procedure vital signs reviewed and stable Respiratory status: spontaneous breathing, nonlabored ventilation, respiratory function stable and patient connected to nasal cannula oxygen Cardiovascular status: blood pressure returned to baseline and stable Postop Assessment: no apparent nausea or vomiting Anesthetic complications: no   No notable events documented.   Last Vitals:  Vitals:   11/21/23 2229 11/22/23 0212  BP: 114/69 (!) 103/59  Pulse: 77 72  Resp: 18 18  Temp: 36.9 C 37.7 C  SpO2: 94% 93%    Last Pain:  Vitals:   11/22/23 0212  TempSrc: Oral  PainSc:                  Portia Brittle Ieasha Boerema

## 2023-11-23 ENCOUNTER — Inpatient Hospital Stay (HOSPITAL_COMMUNITY)
Admit: 2023-11-23 | Discharge: 2023-11-23 | Disposition: A | Discharge status: Home / Self Care | Attending: Infectious Diseases | Admitting: Infectious Diseases

## 2023-11-23 DIAGNOSIS — N390 Urinary tract infection, site not specified: Secondary | ICD-10-CM | POA: Diagnosis not present

## 2023-11-23 DIAGNOSIS — N132 Hydronephrosis with renal and ureteral calculous obstruction: Secondary | ICD-10-CM | POA: Diagnosis not present

## 2023-11-23 DIAGNOSIS — B957 Other staphylococcus as the cause of diseases classified elsewhere: Secondary | ICD-10-CM

## 2023-11-23 DIAGNOSIS — R7881 Bacteremia: Secondary | ICD-10-CM

## 2023-11-23 DIAGNOSIS — A419 Sepsis, unspecified organism: Secondary | ICD-10-CM | POA: Diagnosis not present

## 2023-11-23 DIAGNOSIS — N179 Acute kidney failure, unspecified: Secondary | ICD-10-CM | POA: Diagnosis not present

## 2023-11-23 LAB — CBC
HCT: 36.3 % — ABNORMAL LOW (ref 39.0–52.0)
Hemoglobin: 11.6 g/dL — ABNORMAL LOW (ref 13.0–17.0)
MCH: 30.5 pg (ref 26.0–34.0)
MCHC: 32 g/dL (ref 30.0–36.0)
MCV: 95.5 fL (ref 80.0–100.0)
Platelets: 288 10*3/uL (ref 150–400)
RBC: 3.8 MIL/uL — ABNORMAL LOW (ref 4.22–5.81)
RDW: 13.6 % (ref 11.5–15.5)
WBC: 17.2 10*3/uL — ABNORMAL HIGH (ref 4.0–10.5)
nRBC: 0 % (ref 0.0–0.2)

## 2023-11-23 LAB — GLUCOSE, CAPILLARY
Glucose-Capillary: 119 mg/dL — ABNORMAL HIGH (ref 70–99)
Glucose-Capillary: 165 mg/dL — ABNORMAL HIGH (ref 70–99)
Glucose-Capillary: 100 mg/dL — ABNORMAL HIGH (ref 70–99)
Glucose-Capillary: 203 mg/dL — ABNORMAL HIGH (ref 70–99)

## 2023-11-23 LAB — BASIC METABOLIC PANEL WITH GFR
Anion gap: 8 (ref 5–15)
BUN: 29 mg/dL — ABNORMAL HIGH (ref 8–23)
CO2: 25 mmol/L (ref 22–32)
Calcium: 8.4 mg/dL — ABNORMAL LOW (ref 8.9–10.3)
Chloride: 103 mmol/L (ref 98–111)
Creatinine, Ser: 1.51 mg/dL — ABNORMAL HIGH (ref 0.61–1.24)
GFR, Estimated: 48 mL/min — ABNORMAL LOW (ref >60)
Glucose, Bld: 147 mg/dL — ABNORMAL HIGH (ref 70–99)
Potassium: 4.3 mmol/L (ref 3.5–5.1)
Sodium: 136 mmol/L (ref 135–145)

## 2023-11-23 LAB — LACTIC ACID, PLASMA: Lactic Acid, Venous: 1.4 mmol/L (ref 0.5–1.9)

## 2023-11-23 MED ORDER — TETANUS-DIPHTH-ACELL PERTUSSIS 5-2.5-18.5 LF-MCG/0.5 IM SUSY
0.5000 mL | PREFILLED_SYRINGE | Freq: Once | INTRAMUSCULAR | Status: AC
  Administered 2023-11-23: 0.5 mL via INTRAMUSCULAR
  Filled 2023-11-23: qty 0.5

## 2023-11-23 NOTE — Assessment & Plan Note (Signed)
 Likely secondary to ureteral obstruction.  Creatinine started improving.  No recent baseline available. Received IV fluid. -Monitor renal function -Avoid nephrotoxins

## 2023-11-23 NOTE — Discharge Instructions (Signed)

## 2023-11-23 NOTE — Assessment & Plan Note (Signed)
 Blood pressure mildly elevated - Holding home losartan due to AKI -As needed hydralazine

## 2023-11-23 NOTE — Progress Notes (Signed)
 Progress Note   Patient: Phillip POLCYN Sr. ZOX:[id number] DOB: 1951/06/07 DOA: 11/21/2023     2 DOS: the patient was seen and examined on 11/23/2023   Brief hospital course: Taken from H&P.   Phillip FRISTOE Sr. is a 73 y.o. male with medical history significant of kidney stones HTN, HLD,IIDM, COPD, presented with sudden onset of left-sided flank pain and trouble to urinate.  Patient has an history of kidney stone", for he had a similar symptoms 8 to 9 years ago and when he was treated with ureter stent and antibiotics.   On presentation febrile at 101.4, labs with leukocytosis at 18.9, BUN 18, creatinine 1.9 with baseline of 1, bicarb 21.  Lactic acid 3.4 CT abdomen and pelvis positive for obstructing left ureteral stone.  Patient received IV fluid per sepsis protocol and started on ceftriaxone . Urology was consulted.  5/5: Vitals with low-grade fever, s/p cystoscopy and left ureteral stent placement by urology.  Labs with leukocytosis at 18, creatinine with some improvement to 1.79, blood cultures Staph epidermidis.  Patient work with metal scrapes and air conditioning.  History of frequent carts, most recent was about 1 to 2 weeks ago when he had a cut in his hand with rusty metal, wound has been healed. Urine cultures pending Foley to be removed after 24-hour of being afebrile per urology.  5/6: Remained afebrile, urine cultures seems like also growing MSSE, ID is on board and blood cultures were repeated.  Some improvement in leukocytosis and creatinine.  Foley catheter was removed today.  Assessment and Plan: * Severe sepsis (HCC) Staph epidermidis bacteremia. Complicated UTI with hydronephrosis Patient met severe sepsis criteria with fever, leukocytosis, AKI and lactic acidosis.  Secondary to ureteral stone with hydronephrosis and complicated UTI. S/p ureteral stent placement by urology Blood cultures and urine culture both growing Staph epidermidis-  -ID consult -Ceftriaxone  switched  with cefazolin  -Supportive care -Patient to follow-up with outpatient urology for definitive treatment  AKI (acute kidney injury) (HCC) Likely secondary to ureteral obstruction.  Creatinine started improving.  No recent baseline available. Received IV fluid. -Monitor renal function -Avoid nephrotoxins  Hypertension Blood pressure mildly elevated - Holding home losartan due to AKI -As needed hydralazine  Diabetes mellitus, type 2 (HCC) Holding home metformin -SSI  COPD (chronic obstructive pulmonary disease) (HCC) No concern of exacerbation -Continue home bronchodilator   Subjective: Patient was seen and examined today.  No new concern.  Physical Exam: Vitals:   11/22/23 1309 11/22/23 2046 11/23/23 0334 11/23/23 0754  BP: 126/77 (!) 146/83 (!) 143/85 (!) 140/89  Pulse: 68 74 68 69  Resp:  20 20 15   Temp: 98.3 F (36.8 C) 97.6 F (36.4 C) 97.9 F (36.6 C) 98.6 F (37 C)  TempSrc: Oral     SpO2: 98% 97% 94% 96%  Weight:      Height:       General.  Well-developed elderly man, in no acute distress. Pulmonary.  Lungs clear bilaterally, normal respiratory effort. CV.  Regular rate and rhythm, no JVD, rub or murmur. Abdomen.  Soft, nontender, nondistended, BS positive. CNS.  Alert and oriented .  No focal neurologic deficit. Extremities.  No edema, no cyanosis, pulses intact and symmetrical. Psychiatry.  Judgment and insight appears normal.   Data Reviewed: Prior data reviewed  Family Communication: Talked with son on phone.  Disposition: Status is: Inpatient Remains inpatient appropriate because: Severity of illness  Planned Discharge Destination: Home  DVT prophylaxis.  Lovenox Time spent: 50 minutes  This record has been created using Conservation officer, historic buildings. Errors have been sought and corrected,but may not always be located. Such creation errors do not reflect on the standard of care.   Author: Luna Salinas, MD 11/23/2023 3:32 PM  For on call  review [email or web address].

## 2023-11-23 NOTE — TOC CM/SW Note (Signed)
 Transition of Care [facility]) - Inpatient Brief Assessment   Patient Details  Name: Phillip Galloway. MRN: [id number] Date of Birth: [birth year]  [facility] CM/SW Contact:    Odilia Bennett, LCSW Phone Number: 11/23/2023, 10:16 AM   Clinical Narrative: CSW reviewed chart. SDOH for social isolation. Added resources to AVS. No other TOC needs identified so far. CSW will continue to follow progress. Please place [facility] consult if any needs arise.  Transition of Care Asessment: Insurance and Status: Insurance coverage has been reviewed Patient has primary care physician: Yes Home environment has been reviewed: Single family home Prior level of function:: Not documented Prior/Current Home Services: No current home services Social Drivers of Health Review: SDOH reviewed interventions complete Readmission risk has been reviewed: Yes Transition of care needs: no transition of care needs at this time

## 2023-11-23 NOTE — Progress Notes (Signed)
 Date of Admission:  11/21/2023      ID: Phillip Ferguson Sr. is a 73 y.o. male Principal Problem:   Severe sepsis (HCC) Active Problems:   Ureteral stone with hydronephrosis   Complicated UTI (urinary tract infection)   Diabetes mellitus, type 2 (HCC)   Hypertension   AKI (acute kidney injury) (HCC)   COPD (chronic obstructive pulmonary disease) (HCC)    Subjective: doing well No pain flank or abdomen Foley catheter out and is passing urine well  Medications:   acidophilus  1 capsule Oral Daily   aspirin  81 mg Oral Daily   atorvastatin  40 mg Oral QHS   Chlorhexidine  Gluconate Cloth  6 each Topical Q0600   enoxaparin (LOVENOX) injection  40 mg Subcutaneous Q24H   fluticasone   2 spray Each Nare Daily   insulin aspart  0-9 Units Subcutaneous TID WC    Objective: Vital signs in last 24 hours: Patient Vitals for the past 24 hrs:  BP Temp Pulse Resp SpO2  11/23/23 0754 (!) 140/89 98.6 F (37 C) 69 15 96 %  11/23/23 0334 (!) 143/85 97.9 F (36.6 C) 68 20 94 %  11/22/23 2046 (!) 146/83 97.6 F (36.4 C) 74 20 97 %     L  PHYSICAL EXAM:  General: Alert, cooperative, no distress, appears stated age.  Back: No CVA tenderness. Lungs: Clear to auscultation bilaterally. No Wheezing or Rhonchi. No rales. Heart: Regular rate and rhythm, no murmur, rub or gallop. Abdomen: Soft, non-tender,not distended. Bowel sounds normal. No masses Extremities: atraumatic, no cyanosis. No edema. No clubbing Skin: No rashes or lesions. Or bruising Lymph: Cervical, supraclavicular normal. Neurologic: Grossly non-focal  Lab Results    Latest Ref Rng & Units 11/23/2023    5:19 AM 11/22/2023    5:43 AM 11/21/2023    1:42 PM  CBC  WBC 4.0 - 10.5 K/uL 17.2  18.0  18.9   Hemoglobin 13.0 - 17.0 g/dL 47.8  29.5  62.1   Hematocrit 39.0 - 52.0 % 36.3  35.6  42.3   Platelets 150 - 400 K/uL 288  277  320        Latest Ref Rng & Units 11/23/2023    5:19 AM 11/22/2023    5:43 AM 11/21/2023    1:42 PM  CMP   Glucose 70 - 99 mg/dL 308  657  846   BUN 8 - 23 mg/dL 29  22  18    Creatinine 0.61 - 1.24 mg/dL 9.62  9.52  8.41   Sodium 135 - 145 mmol/L 136  135  134   Potassium 3.5 - 5.1 mmol/L 4.3  5.0  4.1   Chloride 98 - 111 mmol/L 103  102  101   CO2 22 - 32 mmol/L 25  22  21    Calcium 8.9 - 10.3 mg/dL 8.4  8.1  8.7   Total Protein 6.5 - 8.1 g/dL   7.2   Total Bilirubin 0.0 - 1.2 mg/dL   1.1   Alkaline Phos 38 - 126 U/L   82   AST 15 - 41 U/L   22   ALT 0 - 44 U/L   31       Microbiology: 11/21/2023 blood culture 2 set out of 2 is MSSA 11/21/2023 urine culture MSSA 11/21/2023 urine aspirated from the right kidney is an methicillin sensitive Staph epidermidis   studies/Results: DG OR UROLOGY CYSTO IMAGE ([facility] ONLY) Result Date: 11/21/2023 There is no interpretation for this  exam.  This order is for images obtained during a surgical procedure.  Please See "Surgeries" Tab for more information regarding the procedure.   CT ABDOMEN PELVIS W CONTRAST Result Date: 11/21/2023 CLINICAL DATA:  Acute abdominal and left-sided flank pain. Nephrolithiasis. EXAM: CT ABDOMEN AND PELVIS WITH CONTRAST TECHNIQUE: Multidetector CT imaging of the abdomen and pelvis was performed using the standard protocol following bolus administration of intravenous contrast. RADIATION DOSE REDUCTION: This exam was performed according to the departmental dose-optimization program which includes automated exposure control, adjustment of the mA and/or kV according to patient size and/or use of iterative reconstruction technique. CONTRAST:  100mL OMNIPAQUE IOHEXOL 300 MG/ML  SOLN COMPARISON:  Noncontrast CT on 09/02/2016 FINDINGS: Lower Chest: No acute findings. Hepatobiliary: No suspicious hepatic masses identified. Prior cholecystectomy. No evidence of biliary obstruction. Pancreas:  No mass or inflammatory changes. Spleen: Within normal limits in size and appearance. Adrenals/Urinary Tract: No suspicious masses identified. Several small  less than 1 cm renal calculi are again seen bilaterally. Mild left hydroureteronephrosis and perinephric stranding is seen due to a 4 mm distal left ureteral calculus. Stomach/Bowel: No evidence of obstruction, inflammatory process or abnormal fluid collections. Normal appendix visualized. Diverticulosis is seen mainly involving the descending and sigmoid colon, however there is no evidence of diverticulitis. Vascular/Lymphatic: No pathologically enlarged lymph nodes. No acute vascular findings. Reproductive:  Stable mildly enlarged prostate. Other:  None. Musculoskeletal:  No suspicious bone lesions identified. IMPRESSION: Mild left hydroureteronephrosis and perinephric stranding due to 4 mm distal left ureteral calculus. Bilateral nephrolithiasis. Colonic diverticulosis, without radiographic evidence of diverticulitis. Stable mildly enlarged prostate. Electronically Signed   By: Marlyce Sine M.D.   On: 11/21/2023 16:06     Assessment/Plan: Staphylococcus epidermidis bacteremia - high bio burden 2/2 blood set  on cefazolin  Repeat blood culture sent Will get 2 d echo Urine culture is positive for same bacteria If repeat blood culture neg and 2 d echo okay can transition him to Po antibiotic on discharge for a minimum   of 2 weeks but  until lithotripsy   Complicated UTI due to obstructive uropathy left due to ureteric stone- has stent    Has multiple renal stones and they could be colonized   AKI improving   DM on metformin   Tetanus toxoid -as he gets frequent cuts at his job   Discussed management with the patient and care team.

## 2023-11-23 NOTE — Assessment & Plan Note (Addendum)
 Staph epidermidis bacteremia. Complicated UTI with hydronephrosis Patient met severe sepsis criteria with fever, leukocytosis, AKI and lactic acidosis.  Secondary to ureteral stone with hydronephrosis and complicated UTI. S/p ureteral stent placement by urology Blood cultures and urine culture both growing Staph epidermidis-  -ID consult -Ceftriaxone  switched with cefazolin  -Supportive care -Patient to follow-up with outpatient urology for definitive treatment

## 2023-11-23 NOTE — Plan of Care (Signed)
   Problem: Coping: Goal: Level of anxiety will decrease Outcome: Progressing   Problem: Pain Managment: Goal: General experience of comfort will improve and/or be controlled Outcome: Progressing

## 2023-11-24 ENCOUNTER — Other Ambulatory Visit: Payer: Self-pay

## 2023-11-24 DIAGNOSIS — R6520 Severe sepsis without septic shock: Secondary | ICD-10-CM | POA: Diagnosis not present

## 2023-11-24 DIAGNOSIS — A419 Sepsis, unspecified organism: Secondary | ICD-10-CM | POA: Diagnosis not present

## 2023-11-24 LAB — ECHOCARDIOGRAM COMPLETE
AR max vel: 2.84 cm2
AV Area VTI: 2.95 cm2
AV Area mean vel: 2.78 cm2
AV Mean grad: 3.2 mmHg
AV Peak grad: 6.3 mmHg
Ao pk vel: 1.26 m/s
Area-P 1/2: 3.65 cm2
Height: 69.5 in
S' Lateral: 3.6 cm
Weight: 3200 [oz_av]

## 2023-11-24 LAB — GLUCOSE, CAPILLARY
Glucose-Capillary: 140 mg/dL — ABNORMAL HIGH (ref 70–99)
Glucose-Capillary: 149 mg/dL — ABNORMAL HIGH (ref 70–99)
Glucose-Capillary: 163 mg/dL — ABNORMAL HIGH (ref 70–99)

## 2023-11-24 LAB — URINE CULTURE: Culture: 20000 — AB

## 2023-11-24 LAB — CBC
HCT: 36.9 % — ABNORMAL LOW (ref 39.0–52.0)
Hemoglobin: 12.2 g/dL — ABNORMAL LOW (ref 13.0–17.0)
MCH: 30.5 pg (ref 26.0–34.0)
MCHC: 33.1 g/dL (ref 30.0–36.0)
MCV: 92.3 fL (ref 80.0–100.0)
Platelets: 317 10*3/uL (ref 150–400)
RBC: 4 MIL/uL — ABNORMAL LOW (ref 4.22–5.81)
RDW: 13.3 % (ref 11.5–15.5)
WBC: 11 10*3/uL — ABNORMAL HIGH (ref 4.0–10.5)
nRBC: 0 % (ref 0.0–0.2)

## 2023-11-24 LAB — CULTURE, BLOOD (ROUTINE X 2): Special Requests: ADEQUATE

## 2023-11-24 LAB — BASIC METABOLIC PANEL WITH GFR
Anion gap: 7 (ref 5–15)
BUN: 25 mg/dL — ABNORMAL HIGH (ref 8–23)
CO2: 28 mmol/L (ref 22–32)
Calcium: 8.6 mg/dL — ABNORMAL LOW (ref 8.9–10.3)
Chloride: 100 mmol/L (ref 98–111)
Creatinine, Ser: 1.34 mg/dL — ABNORMAL HIGH (ref 0.61–1.24)
GFR, Estimated: 56 mL/min — ABNORMAL LOW (ref >60)
Glucose, Bld: 136 mg/dL — ABNORMAL HIGH (ref 70–99)
Potassium: 3.9 mmol/L (ref 3.5–5.1)
Sodium: 135 mmol/L (ref 135–145)

## 2023-11-24 MED ORDER — LACTINEX PO CHEW: 1.0000 | CHEWABLE_TABLET | Freq: Three times a day (TID) | ORAL | 0 refills | Status: AC

## 2023-11-24 MED ORDER — CEFADROXIL 500 MG PO CAPS: 1000.0000 mg | ORAL_CAPSULE | Freq: Two times a day (BID) | ORAL | 0 refills | Status: DC

## 2023-11-24 MED ORDER — AMLODIPINE BESYLATE 2.5 MG PO TABS
5.0000 mg | ORAL_TABLET | Freq: Every day | ORAL | 0 refills | Status: DC
  Filled 2023-11-24: qty 30, 15d supply, fill #0

## 2023-11-24 NOTE — Discharge Summary (Addendum)
 Physician Discharge Summary   Patient: Phillip DICKEL Sr. MRN: [id number] DOB: 10/07/1950  Admit date:     11/21/2023  Discharge date: 11/24/23  Discharge Physician: Sissy Goetzke   PCP: Evelena Hines, FNP (Inactive)   Recommendations at discharge:   Complete antibiotic therapy as recommended Keep scheduled follow-up appointment with urology Return to emergency room for worsening symptoms  Discharge Diagnoses: Principal Problem:   Severe sepsis (HCC) Active Problems:   Complicated UTI (urinary tract infection)   Staphylococcus epidermidis bacteremia   Ureteral stone with hydronephrosis   AKI (acute kidney injury) (HCC)   Hypertension   Diabetes mellitus, type 2 (HCC)   COPD (chronic obstructive pulmonary disease) (HCC)  Resolved Problems:   * No resolved hospital problems. *  Hospital Course:  Phillip KOLPACK Sr. is a 73 y.o. male with medical history significant of kidney stones HTN, HLD,IIDM, COPD, presented with sudden onset of left-sided flank pain and trouble to urinate.   Symptoms started yesterday morning, patient started to have sudden onset of left sided flank pain radiating to left groin area associated with dysuria.  He also complained about dysuria and subjective fever and chills, and decreased urinary output.  No nausea or vomiting.  He attributed his symptoms to " kidney stone", for he had a similar process 8 to 9 years ago and when he was treated with ureter stent and antibiotics.   ED Course: Fever 101.4, blood pressure 158/83 O2 saturation 97% on room air.  CT abdomen pelvis showed obstructing left ureteral stone 4 mm, blood work showed WBC 18.9 BUN 18 creatinine 1.9 compared to baseline less than 1.0, bicarb 21K 4.1.   Code sepsis was called, patient was given IV bolus 3000 mL saline and ceftriaxone .     Assessment and Plan:    Severe sepsis (HCC) Staph epidermidis bacteremia. Complicated UTI with hydronephrosis Patient met severe sepsis criteria with  fever, leukocytosis, AKI and lactic acidosis on admission.   Source of sepsis is secondary to ureteral stone with hydronephrosis and complicated UTI. S/p ureteral stent placement by urology Blood cultures and urine culture both growing Staph epidermidis-  Appreciate ID consult Patient was initially on ceftriaxone  and then switched with cefazolin  Discussed with ID and patient will be discharged home on cefadroxil to complete a 2-week course of therapy -Patient to follow-up with outpatient urology for definitive treatment    AKI (acute kidney injury) (HCC) Likely secondary to ureteral obstruction.   There has been some improvement in patient's renal function, creatinine is down from 1.90-1.30 Oral hydration encouraged Avoid nephrotoxic agents Continue to hold losartan until resumed by patient's PCP    Hypertension Increase dose of amlodipine to optimize blood pressure control Patient's PCP may resume losartan once renal function is back to baseline    Diabetes mellitus, type 2 (HCC) Continue metformin Continue consistent carbohydrate diet Check blood sugars daily   COPD (chronic obstructive pulmonary disease) (HCC) No concern of exacerbation -Continue home bronchodilator         Consultants: Urology Procedures performed: Cystoscopy, left retrograde pyelogram, left ureteral stent placement Disposition: Home Diet recommendation:  Discharge Diet Orders (From admission, onward)     Start     Ordered   11/24/23 0000  Diet - low sodium heart healthy        11/24/23 1146   11/24/23 0000  Diet Carb Modified        11/24/23 1146           Cardiac and Carb modified  diet DISCHARGE MEDICATION: Allergies as of 11/24/2023       Reactions   Codeine Shortness Of Breath   "jitters and jerks"   Darvon [propoxyphene] Shortness Of Breath   Corn Syrup [glucose] Other (See Comments)   Gout attack   Shellfish Allergy    Aggravates gout        Medication List     PAUSE  taking these medications    losartan 100 MG tablet Wait to take this until your doctor or other care provider tells you to start again. Commonly known as: COZAAR Take 100 mg by mouth daily.       TAKE these medications    AeroChamber MV inhaler Use as instructed   amLODipine 2.5 MG tablet Commonly known as: NORVASC Take 2 tablets (5 mg total) by mouth daily. What changed: how much to take   atorvastatin 40 MG tablet Commonly known as: LIPITOR Take 40 mg by mouth at bedtime.   cefadroxil 500 MG capsule Commonly known as: DURICEF Take 2 capsules (1,000 mg total) by mouth 2 (two) times daily for 16 days.   lactobacillus acidophilus & bulgar chewable tablet Chew 1 tablet by mouth 3 (three) times daily with meals for 14 days.   magnesium oxide 400 MG tablet Commonly known as: MAG-OX Take 1 tablet by mouth 2 (two) times daily.   metFORMIN 500 MG tablet Commonly known as: GLUCOPHAGE Take 1,000 mg by mouth 2 (two) times daily with a meal.        Follow-up Information     Lawerence Pressman, MD Follow up in 2 day(s).   Specialty: Urology Why: Please call office to schedule a follow up Contact information: [address] [place] [address] [phone]         Evelena Hines, FNP Follow up in 1 week(s).   Specialty: Family Medicine Why: Please call the office for your follow up Contact information: [address] [place] [address] [phone]                Discharge Exam: Phillip Galloway   11/21/23 1340  Weight: 90.7 kg   General.  Well-developed elderly man, in no acute distress. Pulmonary.  Lungs clear bilaterally, normal respiratory effort. CV.  Regular rate and rhythm, no JVD, rub or murmur. Abdomen.  Soft, nontender, nondistended, BS positive. CNS.  Alert and oriented .  No focal neurologic deficit. Extremities.  No edema, no cyanosis, pulses intact and symmetrical. Psychiatry.  Judgment and insight appears normal.      Condition at discharge: stable  The results of significant diagnostics from this hospitalization (including imaging, microbiology, ancillary and laboratory) are listed below for reference.   Imaging Studies: ECHOCARDIOGRAM COMPLETE Result Date: 11/24/2023    ECHOCARDIOGRAM REPORT   Patient Name:   Phillip BANNER Sr. Date of Exam: 11/23/2023 Medical Rec #:  [id number]        Height:       69.5 in Accession #:    [id number]       Weight:       200.0 lb Date of Birth:  [birth year]         BSA:          2.076 m Patient Age:    72 years         BP:           143/85 mmHg Patient Gender: M  HR:           78 bpm. Exam Location:  [facility] Procedure: 2D Echo, Cardiac Doppler and Color Doppler (Both Spectral and Color            Flow Doppler were utilized during procedure). Indications:     R78.81 Bacteremia  History:         Patient has no prior history of Echocardiogram examinations.                  Risk Factors:Diabetes and Dyslipidemia.  Sonographer:     Brigid Canada RDCS Referring Phys:  [id number] Alica Inks Diagnosing Phys: Sammy Crisp MD IMPRESSIONS  1. Left ventricular ejection fraction, by estimation, is 55 to 60%. The left ventricle has normal function. Left ventricular endocardial border not optimally defined to evaluate regional wall motion. Left ventricular diastolic parameters are consistent with Grade I diastolic dysfunction (impaired relaxation).  2. Right ventricular systolic function is normal. The right ventricular size is mildly enlarged.  3. The mitral valve is grossly normal. Trivial mitral valve regurgitation. No evidence of mitral stenosis.  4. The aortic valve is tricuspid. There is mild calcification of the aortic valve. Aortic valve regurgitation is not visualized. Aortic valve sclerosis/calcification is present, without any evidence of aortic stenosis.  5. Aortic dilatation noted. There is mild dilatation of the aortic root, measuring 42 mm. There is  borderline dilatation of the ascending aorta, measuring 37 mm.  6. The inferior vena cava is normal in size with greater than 50% respiratory variability, suggesting right atrial pressure of 3 mmHg. Conclusion(s)/Recommendation(s): No definite vegetation identified, though valves are suboptimally visualized. Consider TEE if clinical concern for infective endocarditis persists. FINDINGS  Left Ventricle: Left ventricular ejection fraction, by estimation, is 55 to 60%. The left ventricle has normal function. Left ventricular endocardial border not optimally defined to evaluate regional wall motion. The left ventricular internal cavity size was normal in size. There is borderline left ventricular hypertrophy. Left ventricular diastolic parameters are consistent with Grade I diastolic dysfunction (impaired relaxation). Right Ventricle: The right ventricular size is mildly enlarged. No increase in right ventricular wall thickness. Right ventricular systolic function is normal. Left Atrium: Left atrial size was normal in size. Right Atrium: Right atrial size was normal in size. Pericardium: There is no evidence of pericardial effusion. Mitral Valve: The mitral valve is grossly normal. Trivial mitral valve regurgitation. No evidence of mitral valve stenosis. Tricuspid Valve: The tricuspid valve is grossly normal. Tricuspid valve regurgitation is not demonstrated. Aortic Valve: The aortic valve is tricuspid. There is mild calcification of the aortic valve. Aortic valve regurgitation is not visualized. Aortic valve sclerosis/calcification is present, without any evidence of aortic stenosis. Aortic valve mean gradient measures 3.2 mmHg. Aortic valve peak gradient measures 6.3 mmHg. Aortic valve area, by VTI measures 2.95 cm. Pulmonic Valve: The pulmonic valve was not well visualized. Pulmonic valve regurgitation is not visualized. No evidence of pulmonic stenosis. Aorta: Aortic dilatation noted. There is mild dilatation of  the aortic root, measuring 42 mm. There is borderline dilatation of the ascending aorta, measuring 37 mm. Pulmonary Artery: The pulmonary artery is not well seen. Venous: The inferior vena cava is normal in size with greater than 50% respiratory variability, suggesting right atrial pressure of 3 mmHg. IAS/Shunts: No atrial level shunt detected by color flow Doppler.  LEFT VENTRICLE PLAX 2D LVIDd:         4.90 cm   Diastology LVIDs:  3.60 cm   LV e' medial:    5.94 cm/s LV PW:         1.10 cm   LV E/e' medial:  12.8 LV IVS:        1.10 cm   LV e' lateral:   5.88 cm/s LVOT diam:     2.20 cm   LV E/e' lateral: 13.0 LV SV:         66 LV SV Index:   32 LVOT Area:     3.80 cm  RIGHT VENTRICLE             IVC RV Basal diam:  4.40 cm     IVC diam: 1.40 cm RV S prime:     14.80 cm/s TAPSE (M-mode): 2.3 cm LEFT ATRIUM             Index        RIGHT ATRIUM           Index LA diam:        4.80 cm 2.31 cm/m   RA Area:     12.40 cm LA Vol (A2C):   51.6 ml 24.85 ml/m  RA Volume:   31.00 ml  14.93 ml/m LA Vol (A4C):   57.9 ml 27.89 ml/m LA Biplane Vol: 56.4 ml 27.16 ml/m  AORTIC VALVE AV Area (Vmax):    2.84 cm AV Area (Vmean):   2.78 cm AV Area (VTI):     2.95 cm AV Vmax:           125.59 cm/s AV Vmean:          84.964 cm/s AV VTI:            0.224 m AV Peak Grad:      6.3 mmHg AV Mean Grad:      3.2 mmHg LVOT Vmax:         93.80 cm/s LVOT Vmean:        62.167 cm/s LVOT VTI:          0.174 m LVOT/AV VTI ratio: 0.78  AORTA Ao Root diam: 4.20 cm Ao Asc diam:  3.70 cm MITRAL VALVE MV Area (PHT): 3.65 cm     SHUNTS MV Decel Time: 208 msec     Systemic VTI:  0.17 m MV E velocity: 76.25 cm/s   Systemic Diam: 2.20 cm MV A velocity: 100.50 cm/s MV E/A ratio:  0.76 Veryl Gottron End MD Electronically signed by Sammy Crisp MD Signature Date/Time: 11/24/2023/6:34:33 AM    Final    DG OR UROLOGY CYSTO IMAGE ([facility] ONLY) Result Date: 11/21/2023 There is no interpretation for this exam.  This order is for images obtained  during a surgical procedure.  Please See "Surgeries" Tab for more information regarding the procedure.   CT ABDOMEN PELVIS W CONTRAST Result Date: 11/21/2023 CLINICAL DATA:  Acute abdominal and left-sided flank pain. Nephrolithiasis. EXAM: CT ABDOMEN AND PELVIS WITH CONTRAST TECHNIQUE: Multidetector CT imaging of the abdomen and pelvis was performed using the standard protocol following bolus administration of intravenous contrast. RADIATION DOSE REDUCTION: This exam was performed according to the departmental dose-optimization program which includes automated exposure control, adjustment of the mA and/or kV according to patient size and/or use of iterative reconstruction technique. CONTRAST:  100mL OMNIPAQUE IOHEXOL 300 MG/ML  SOLN COMPARISON:  Noncontrast CT on 09/02/2016 FINDINGS: Lower Chest: No acute findings. Hepatobiliary: No suspicious hepatic masses identified. Prior cholecystectomy. No evidence of biliary obstruction. Pancreas:  No mass or inflammatory changes. Spleen: Within  normal limits in size and appearance. Adrenals/Urinary Tract: No suspicious masses identified. Several small less than 1 cm renal calculi are again seen bilaterally. Mild left hydroureteronephrosis and perinephric stranding is seen due to a 4 mm distal left ureteral calculus. Stomach/Bowel: No evidence of obstruction, inflammatory process or abnormal fluid collections. Normal appendix visualized. Diverticulosis is seen mainly involving the descending and sigmoid colon, however there is no evidence of diverticulitis. Vascular/Lymphatic: No pathologically enlarged lymph nodes. No acute vascular findings. Reproductive:  Stable mildly enlarged prostate. Other:  None. Musculoskeletal:  No suspicious bone lesions identified. IMPRESSION: Mild left hydroureteronephrosis and perinephric stranding due to 4 mm distal left ureteral calculus. Bilateral nephrolithiasis. Colonic diverticulosis, without radiographic evidence of diverticulitis.  Stable mildly enlarged prostate. Electronically Signed   By: Marlyce Sine M.D.   On: 11/21/2023 16:06    Microbiology: Results for orders placed or performed during the hospital encounter of 11/21/23  Urine Culture     Status: Abnormal (Preliminary result)   Collection Time: 11/21/23  1:42 PM   Specimen: Urine, Random  Result Value Ref Range Status   Specimen Description   Final    URINE, RANDOM Performed at [facility], [address] [place], [place] [address]    Special Requests   Final    NONE Performed at [facility], [address], [place], [place] [address]    Culture (A)  Final    >=100,000 COLONIES/mL STAPHYLOCOCCUS EPIDERMIDIS SUSCEPTIBILITIES TO FOLLOW Performed at [facility], [address], [place], [place] [address]    Report Status PENDING  Incomplete  Blood culture (routine x 2)     Status: Abnormal (Preliminary result)   Collection Time: 11/21/23  2:52 PM   Specimen: BLOOD  Result Value Ref Range Status   Specimen Description   Final    BLOOD LEFT ANTECUBITAL Performed at [facility], [address] [place], [place] [address]    Special Requests   Final    BOTTLES DRAWN AEROBIC AND ANAEROBIC Blood Culture results may not be optimal due to an inadequate volume of blood received in culture bottles Performed at [facility], [address] [place], [place] [address]    Culture  Setup Time   Final    GRAM POSITIVE COCCI IN BOTH AEROBIC AND ANAEROBIC BOTTLES CRITICAL VALUE NOTED.  VALUE IS CONSISTENT WITH PREVIOUSLY REPORTED AND CALLED VALUE. GRAM STAIN REVIEWED-AGREE WITH RESULT DRT Performed at [facility], [address], [place], [place] [address]    Culture STAPHYLOCOCCUS EPIDERMIDIS (A)  Final   Report Status PENDING  Incomplete  Blood culture (routine x 2)     Status: Abnormal (Preliminary result)   Collection Time: 11/21/23  2:54 PM   Specimen: BLOOD  Result Value Ref Range Status    Specimen Description   Final    BLOOD RIGHT ANTECUBITAL Performed at [facility] [address] [place], [place] [address]    Special Requests   Final    BOTTLES DRAWN AEROBIC AND ANAEROBIC Blood Culture adequate volume Performed at [facility], [address], [place], [place] [address]    Culture  Setup Time   Final    GRAM POSITIVE COCCI IN BOTH AEROBIC AND ANAEROBIC BOTTLES CRITICAL RESULT CALLED TO, READ BACK BY AND VERIFIED WITH: AMANDA [facility] 11/22/23 @ 0734 BY SH Performed at [facility], [address], [place], [place] [address]    Culture STAPHYLOCOCCUS EPIDERMIDIS (A)  Final   Report  Status PENDING  Incomplete  Blood Culture ID Panel (Reflexed)     Status: Abnormal   Collection Time: 11/21/23  2:54 PM  Result Value Ref Range Status   Enterococcus faecalis NOT DETECTED NOT DETECTED Final   Enterococcus Faecium NOT DETECTED NOT DETECTED Final   Listeria monocytogenes NOT DETECTED NOT DETECTED Final   Staphylococcus species DETECTED (A) NOT DETECTED Final    Comment: CRITICAL RESULT CALLED TO, READ BACK BY AND VERIFIED WITH: AMANDA CHOI 11/22/23 @ 0724 BY SH    Staphylococcus aureus (BCID) NOT DETECTED NOT DETECTED Final   Staphylococcus epidermidis DETECTED (A) NOT DETECTED Final    Comment: CRITICAL RESULT CALLED TO, READ BACK BY AND VERIFIED WITH: AMANDA CHOI 11/22/23 @ 0724 BY SH    Staphylococcus lugdunensis NOT DETECTED NOT DETECTED Final   Streptococcus species NOT DETECTED NOT DETECTED Final   Streptococcus agalactiae NOT DETECTED NOT DETECTED Final   Streptococcus pneumoniae NOT DETECTED NOT DETECTED Final   Streptococcus pyogenes NOT DETECTED NOT DETECTED Final   A.calcoaceticus-baumannii NOT DETECTED NOT DETECTED Final   Bacteroides fragilis NOT DETECTED NOT DETECTED Final   Enterobacterales NOT DETECTED NOT DETECTED Final   Enterobacter cloacae complex NOT DETECTED NOT DETECTED Final   Escherichia coli NOT DETECTED NOT DETECTED Final    Klebsiella aerogenes NOT DETECTED NOT DETECTED Final   Klebsiella oxytoca NOT DETECTED NOT DETECTED Final   Klebsiella pneumoniae NOT DETECTED NOT DETECTED Final   Proteus species NOT DETECTED NOT DETECTED Final   Salmonella species NOT DETECTED NOT DETECTED Final   Serratia marcescens NOT DETECTED NOT DETECTED Final   Haemophilus influenzae NOT DETECTED NOT DETECTED Final   Neisseria meningitidis NOT DETECTED NOT DETECTED Final   Pseudomonas aeruginosa NOT DETECTED NOT DETECTED Final   Stenotrophomonas maltophilia NOT DETECTED NOT DETECTED Final   Candida albicans NOT DETECTED NOT DETECTED Final   Candida auris NOT DETECTED NOT DETECTED Final   Candida glabrata NOT DETECTED NOT DETECTED Final   Candida krusei NOT DETECTED NOT DETECTED Final   Candida parapsilosis NOT DETECTED NOT DETECTED Final   Candida tropicalis NOT DETECTED NOT DETECTED Final   Cryptococcus neoformans/gattii NOT DETECTED NOT DETECTED Final   Methicillin resistance mecA/C NOT DETECTED NOT DETECTED Final    Comment: Performed at [facility], [address] [place], [place] [address]  Urine Culture     Status: Abnormal   Collection Time: 11/21/23  6:54 PM   Specimen: Urine, Catheterized  Result Value Ref Range Status   Specimen Description   Final    URINE, CATHETERIZED Performed at [facility], [address] [place], [place] [address]    Special Requests   Final    NONE Performed at [facility], [address] [place], [place] [address]    Culture 20,000 COLONIES/mL STAPHYLOCOCCUS EPIDERMIDIS (A)  Final   Report Status 11/24/2023 FINAL  Final   Organism ID, Bacteria STAPHYLOCOCCUS EPIDERMIDIS (A)  Final      Susceptibility   Staphylococcus epidermidis - MIC*    CIPROFLOXACIN >=8 RESISTANT Resistant     GENTAMICIN <=0.5 SENSITIVE Sensitive     NITROFURANTOIN <=16 SENSITIVE Sensitive     OXACILLIN <=0.25 SENSITIVE Sensitive     TETRACYCLINE 4 SENSITIVE Sensitive      VANCOMYCIN <=0.5 SENSITIVE Sensitive     TRIMETH /SULFA  80 RESISTANT Resistant     RIFAMPIN <=0.5 SENSITIVE Sensitive     Inducible Clindamycin  POSITIVE Resistant     * 20,000 COLONIES/mL STAPHYLOCOCCUS EPIDERMIDIS  Culture, blood (Routine  X 2) w Reflex to ID Panel     Status: None (Preliminary result)   Collection Time: 11/23/23  5:19 AM   Specimen: BLOOD RIGHT HAND  Result Value Ref Range Status   Specimen Description BLOOD RIGHT HAND  Final   Special Requests   Final    BOTTLES DRAWN AEROBIC AND ANAEROBIC Blood Culture adequate volume   Culture   Final    NO GROWTH 1 DAY Performed at [facility], [address] [place], [place] [address]    Report Status PENDING  Incomplete  Culture, blood (Routine X 2) w Reflex to ID Panel     Status: None (Preliminary result)   Collection Time: 11/23/23  5:19 AM   Specimen: BLOOD LEFT HAND  Result Value Ref Range Status   Specimen Description BLOOD LEFT HAND  Final   Special Requests   Final    BOTTLES DRAWN AEROBIC AND ANAEROBIC Blood Culture adequate volume   Culture   Final    NO GROWTH 1 DAY Performed at [facility], [address] [place], [place] [address]    Report Status PENDING  Incomplete    Labs: CBC: Recent Labs  Lab 11/21/23 1342 11/22/23 0543 11/23/23 0519 11/24/23 0550  WBC 18.9* 18.0* 17.2* 11.0*  HGB 13.8 11.7* 11.6* 12.2*  HCT 42.3 35.6* 36.3* 36.9*  MCV 94.6 94.7 95.5 92.3  PLT 320 277 288 317   Basic Metabolic Panel: Recent Labs  Lab 11/21/23 1342 11/22/23 0543 11/23/23 0519 11/24/23 0550  NA 134* 135 136 135  K 4.1 5.0 4.3 3.9  CL 101 102 103 100  CO2 21* 22 25 28   GLUCOSE 208* 281* 147* 136*  BUN 18 22 29* 25*  CREATININE 1.90* 1.79* 1.51* 1.34*  CALCIUM 8.7* 8.1* 8.4* 8.6*   Liver Function Tests: Recent Labs  Lab 11/21/23 1342  AST 22  ALT 31  ALKPHOS 82  BILITOT 1.1  PROT 7.2  ALBUMIN 3.5   CBG: Recent Labs  Lab 11/23/23 1625 11/23/23 2132 11/24/23 0724  11/24/23 1015 11/24/23 1131  GLUCAP 100* 203* 140* 149* 163*    Discharge time spent: greater than 30 minutes.  Signed: Read Camel, MD [facility] 11/24/2023

## 2023-11-25 ENCOUNTER — Telehealth: Payer: Self-pay

## 2023-11-25 ENCOUNTER — Other Ambulatory Visit: Payer: Self-pay

## 2023-11-25 DIAGNOSIS — N201 Calculus of ureter: Secondary | ICD-10-CM

## 2023-11-25 LAB — URINE CULTURE: Culture: >=100000 — AB

## 2023-11-25 NOTE — Progress Notes (Signed)
 Surgical Physician Order Form [facility] [place]  Dr. Estanislao Heimlich * Scheduling expectation : 12/10/2023  *Length of Case:   *Clearance needed: no  *Anticoagulation Instructions: N/A  *Aspirin Instructions: N/A  *Post-op visit Date/Instructions:  TBD  *Diagnosis: Left Ureteral Stone  *Procedure: left  Ureteroscopy w/laser lithotripsy & stent exchange ([address])   Additional orders: N/A  -Admit type: OUTpatient  -Anesthesia: General  -VTE Prophylaxis Standing Order SCD's       Other:   -Standing Lab Orders Per Anesthesia    Lab other: None  -Standing Test orders EKG/Chest x-ray per Anesthesia       Test other:   - Medications:  TBD per preop cx  -Other orders:  N/A

## 2023-11-25 NOTE — Progress Notes (Signed)
   [facility] Surgical Posting Form  Surgery Date: Date: 12/10/2023  Surgeon: Dr. Jay Meth, MD  Inpt ( No  )   Outpt (Yes)   Obs ( No  )   Diagnosis: N20.1 Left Ureteral Stone  -CPT: [phone]  Surgery: Left Ureteroscopy with Laser Lithotripsy and Stent Exchange  Stop Anticoagulations: No  Cardiac/Medical/Pulmonary Clearance needed: no  *Orders entered into EPIC  Date: 11/25/23   *Case booked in [place]  Date: 11/25/23  *Notified pt of Surgery: Date: 11/25/23  PRE-OP UA & CX: no  *Placed into Prior Authorization Work Que Date: 11/25/23  Assistant/laser/rep:No

## 2023-11-25 NOTE — Telephone Encounter (Signed)
 Per Dr. Estanislao Heimlich, MD Patient is to be scheduled for  Left Ureteroscopy with Laser Lithotripsy and Stent Exchange   Mr. Girten was contacted and possible surgical dates were discussed, Friday May 23rd, 2025 was agreed upon for surgery. Patient was directed to call [phone] between 1-3pm the day before surgery to find out surgical arrival time.  Instructions were given not to eat or drink from midnight on the night before surgery and have a driver for the day of surgery. On the surgery day patient was instructed to enter through the [facility] entrance of [facility] report the Same Day Surgery desk.   Pre-Admit Testing will be in contact via phone to set up an interview with the anesthesia team to review your history and medications prior to surgery.   Reminder of this information was sent via MyChart to the patient.

## 2023-11-28 LAB — CULTURE, BLOOD (ROUTINE X 2)
Culture: NO GROWTH
Culture: NO GROWTH
Special Requests: ADEQUATE
Special Requests: ADEQUATE

## 2023-12-03 ENCOUNTER — Inpatient Hospital Stay: Admission: RE | Admit: 2023-12-03 | Source: Ambulatory Visit

## 2023-12-06 ENCOUNTER — Other Ambulatory Visit: Payer: Self-pay

## 2023-12-06 ENCOUNTER — Encounter
Admission: RE | Admit: 2023-12-06 | Discharge: 2023-12-06 | Disposition: A | Discharge status: Home / Self Care | Source: Ambulatory Visit | Attending: Urology | Admitting: Urology

## 2023-12-06 VITALS — Ht 69.5 in | Wt 200.0 lb

## 2023-12-06 DIAGNOSIS — Z01812 Encounter for preprocedural laboratory examination: Secondary | ICD-10-CM

## 2023-12-06 HISTORY — DX: Gout, unspecified: M10.9

## 2023-12-06 HISTORY — DX: Mixed hyperlipidemia: E78.2

## 2023-12-06 NOTE — Patient Instructions (Addendum)
 Your procedure is scheduled on: Friday, May 23 Report to the Registration Desk on the 1st floor of the [facility] To find out your arrival time, please call [phone] between 1PM - 3PM on: Thursday, May 22 If your arrival time is 6:00 am, do not arrive before that time as the [facility] entrance doors do not open until 6:00 am.  REMEMBER: Instructions that are not followed completely may result in serious medical risk, up to and including death; or upon the discretion of your surgeon and anesthesiologist your surgery may need to be rescheduled.  Do not eat or drink after midnight the night before surgery.  No gum chewing or hard candies.  One week prior to surgery: Stop aspirin  and Anti-inflammatories (NSAIDS) such as Advil, Aleve , Ibuprofen, Motrin, Naproxen , Naprosyn  and Aspirin  based products such as Excedrin, Goody's Powder, BC Powder. Stop ANY OVER THE COUNTER supplements until after surgery.   You may however, continue to take Tylenol  if needed for pain up until the day of surgery.  Metformin - hold for 2 days before surgery. Last day to take is Tuesday, May 20. Resume AFTER surgery.  Continue taking all of your other prescription medications up until the day of surgery.  ON THE DAY OF SURGERY ONLY TAKE THESE MEDICATIONS WITH SIPS OF WATER:  cefadroxil  (DURICEF)   No Alcohol for 24 hours before or after surgery.  No Smoking including e-cigarettes for 24 hours before surgery.  No chewable tobacco products for at least 6 hours before surgery.  No nicotine patches on the day of surgery.  Do not use any "recreational" drugs for at least a week (preferably 2 weeks) before your surgery.  Please be advised that the combination of cocaine and anesthesia may have negative outcomes, up to and including death. If you test positive for cocaine, your surgery will be cancelled.  On the morning of surgery brush your teeth with toothpaste and water, you may rinse your mouth with  mouthwash if you wish. Do not swallow any toothpaste or mouthwash.  Do not wear jewelry, make-up, hairpins, clips or nail polish.  For welded (permanent) jewelry: bracelets, anklets, waist bands, etc.  Please have this removed prior to surgery.  If it is not removed, there is a chance that hospital personnel will need to cut it off on the day of surgery.  Do not wear lotions, powders, or perfumes.   Do not shave body hair from the neck down 48 hours before surgery.  Contact lenses, hearing aids and dentures may not be worn into surgery.  Do not bring valuables to the hospital. [facility] is not responsible for any missing/lost belongings or valuables.   Notify your doctor if there is any change in your medical condition (cold, fever, infection).  Wear comfortable clothing (specific to your surgery type) to the hospital.  After surgery, you can help prevent lung complications by doing breathing exercises.  Take deep breaths and cough every 1-2 hours.   If you are being discharged the day of surgery, you will not be allowed to drive home. You will need a responsible individual to drive you home and stay with you for 24 hours after surgery.   If you are taking public transportation, you will need to have a responsible individual with you.  Please call the Pre-admissions Testing Dept. at [phone] if you have any questions about these instructions.  Surgery Visitation Policy:  Patients having surgery or a procedure may have two visitors.  Children  under the age of 9 must have an adult with them who is not the patient.

## 2023-12-07 ENCOUNTER — Emergency Department
Admission: EM | Admit: 2023-12-07 | Discharge: 2023-12-07 | Disposition: A | Discharge status: Home / Self Care | Attending: Emergency Medicine | Admitting: Emergency Medicine

## 2023-12-07 ENCOUNTER — Telehealth: Payer: Self-pay

## 2023-12-07 DIAGNOSIS — R300 Dysuria: Secondary | ICD-10-CM | POA: Diagnosis not present

## 2023-12-07 DIAGNOSIS — Z87440 Personal history of urinary (tract) infections: Secondary | ICD-10-CM | POA: Insufficient documentation

## 2023-12-07 DIAGNOSIS — Z87442 Personal history of urinary calculi: Secondary | ICD-10-CM | POA: Diagnosis not present

## 2023-12-07 DIAGNOSIS — G8918 Other acute postprocedural pain: Secondary | ICD-10-CM | POA: Diagnosis present

## 2023-12-07 LAB — CBC WITH DIFFERENTIAL/PLATELET
Abs Immature Granulocytes: 0.09 10*3/uL — ABNORMAL HIGH (ref 0.00–0.07)
Basophils Absolute: 0.1 10*3/uL (ref 0.0–0.1)
Basophils Relative: 1 %
Eosinophils Absolute: 0.1 10*3/uL (ref 0.0–0.5)
Eosinophils Relative: 1 %
HCT: 41.3 % (ref 39.0–52.0)
Hemoglobin: 13.3 g/dL (ref 13.0–17.0)
Immature Granulocytes: 1 %
Lymphocytes Relative: 8 %
Lymphs Abs: 1.1 10*3/uL (ref 0.7–4.0)
MCH: 30 pg (ref 26.0–34.0)
MCHC: 32.2 g/dL (ref 30.0–36.0)
MCV: 93.2 fL (ref 80.0–100.0)
Monocytes Absolute: 0.4 10*3/uL (ref 0.1–1.0)
Monocytes Relative: 3 %
Neutro Abs: 12.1 10*3/uL — ABNORMAL HIGH (ref 1.7–7.7)
Neutrophils Relative %: 86 %
Platelets: 414 10*3/uL — ABNORMAL HIGH (ref 150–400)
RBC: 4.43 MIL/uL (ref 4.22–5.81)
RDW: 13.1 % (ref 11.5–15.5)
WBC: 13.9 10*3/uL — ABNORMAL HIGH (ref 4.0–10.5)
nRBC: 0 % (ref 0.0–0.2)

## 2023-12-07 LAB — URINALYSIS, ROUTINE W REFLEX MICROSCOPIC
Bilirubin Urine: NEGATIVE
Glucose, UA: 50 mg/dL — AB
Ketones, ur: NEGATIVE mg/dL
Nitrite: NEGATIVE
Protein, ur: NEGATIVE mg/dL
RBC / HPF: >50 RBC/hpf (ref 0–5)
Specific Gravity, Urine: 1.017 (ref 1.005–1.030)
pH: 5 (ref 5.0–8.0)

## 2023-12-07 LAB — COMPREHENSIVE METABOLIC PANEL WITH GFR
ALT: 36 U/L (ref 0–44)
AST: 22 U/L (ref 15–41)
Albumin: 3.5 g/dL (ref 3.5–5.0)
Alkaline Phosphatase: 81 U/L (ref 38–126)
Anion gap: 10 (ref 5–15)
BUN: 14 mg/dL (ref 8–23)
CO2: 26 mmol/L (ref 22–32)
Calcium: 8.7 mg/dL — ABNORMAL LOW (ref 8.9–10.3)
Chloride: 98 mmol/L (ref 98–111)
Creatinine, Ser: 1.29 mg/dL — ABNORMAL HIGH (ref 0.61–1.24)
GFR, Estimated: 58 mL/min — ABNORMAL LOW (ref >60)
Glucose, Bld: 219 mg/dL — ABNORMAL HIGH (ref 70–99)
Potassium: 4.2 mmol/L (ref 3.5–5.1)
Sodium: 134 mmol/L — ABNORMAL LOW (ref 135–145)
Total Bilirubin: 0.9 mg/dL (ref 0.0–1.2)
Total Protein: 7 g/dL (ref 6.5–8.1)

## 2023-12-07 MED ORDER — ONDANSETRON HCL 4 MG/2ML IJ SOLN
4.0000 mg | Freq: Once | INTRAMUSCULAR | Status: AC
  Administered 2023-12-07: 4 mg via INTRAVENOUS
  Filled 2023-12-07: qty 2

## 2023-12-07 MED ORDER — SODIUM CHLORIDE 0.9 % IV SOLN
1.0000 g | Freq: Once | INTRAVENOUS | Status: AC
  Administered 2023-12-07: 1 g via INTRAVENOUS
  Filled 2023-12-07: qty 10

## 2023-12-07 MED ORDER — DOXYCYCLINE HYCLATE 50 MG PO CAPS
100.0000 mg | ORAL_CAPSULE | Freq: Two times a day (BID) | ORAL | 0 refills | Status: AC
Start: 2023-12-07 — End: 2023-12-17

## 2023-12-07 MED ORDER — LACTATED RINGERS IV BOLUS
1000.0000 mL | Freq: Once | INTRAVENOUS | Status: AC
  Administered 2023-12-07: 1000 mL via INTRAVENOUS

## 2023-12-07 MED ORDER — FENTANYL CITRATE PF 50 MCG/ML IJ SOSY
50.0000 ug | PREFILLED_SYRINGE | Freq: Once | INTRAMUSCULAR | Status: AC
  Administered 2023-12-07: 50 ug via INTRAVENOUS
  Filled 2023-12-07: qty 1

## 2023-12-07 NOTE — ED Provider Notes (Signed)
 [facility] Provider Note    Event Date/Time   First MD Initiated Contact with Patient 12/07/23 1558     (approximate)   History   Post-op Problem   HPI Phillip Galloway. is a 73 y.o. male presenting today for postoperative problem.  Patient has history of prior located UTIs as well as kidney stones presenting today 2 weeks after having stents placed in his ureter.  He states he has had ongoing pain since stent placement on his left flank.  It has not worsened however.  Has some days where he feels worse than normal.  This morning started having some chills but no true fevers.  Denies any nausea or vomiting.  Still tolerating p.o.  Chart review: Patient with recent admission and discharged on 11/24/2023 for sepsis secondary to infected kidney stone.     Physical Exam   Triage Vital Signs: ED Triage Vitals  Encounter Vitals Group     BP 12/07/23 1450 (!) 147/88     Systolic BP Percentile --      Diastolic BP Percentile --      Pulse Rate 12/07/23 1450 93     Resp 12/07/23 1450 19     Temp 12/07/23 1450 98.4 F (36.9 C)     Temp Source 12/07/23 1450 Oral     SpO2 12/07/23 1450 100 %     Weight 12/07/23 1451 200 lb (90.7 kg)     Height 12/07/23 1451 5' 9.5" (1.765 m)     Head Circumference --      Peak Flow --      Pain Score 12/07/23 1451 5     Pain Loc --      Pain Education --      Exclude from Growth Chart --     Most recent vital signs: Vitals:   12/07/23 1600 12/07/23 1630  BP: 139/75 136/79  Pulse: 94 97  Resp:  16  Temp:    SpO2: 100% 95%   Physical Exam: I have reviewed the vital signs and nursing notes. General: Awake, alert, no acute distress.  Nontoxic appearing. Head:  Atraumatic, normocephalic.   ENT:  EOM intact, PERRL. Oral mucosa is pink and moist with no lesions. Neck: Neck is supple with full range of motion, No meningeal signs. Cardiovascular:  RRR, No murmurs. Peripheral pulses palpable and equal  bilaterally. Respiratory:  Symmetrical chest wall expansion.  No rhonchi, rales, or wheezes.  Good air movement throughout.  No use of accessory muscles.   Musculoskeletal:  No cyanosis or edema. Moving extremities with full ROM Abdomen:  Soft, nontender, nondistended.  No significant CVA tenderness on the left side Neuro:  GCS 15, moving all four extremities, interacting appropriately. Speech clear. Psych:  Calm, appropriate.   Skin:  Warm, dry, no rash.    ED Results / Procedures / Treatments   Labs (all labs ordered are listed, but only abnormal results are displayed) Labs Reviewed  CBC WITH DIFFERENTIAL/PLATELET - Abnormal; Notable for the following components:      Result Value   WBC 13.9 (*)    Platelets 414 (*)    Neutro Abs 12.1 (*)    Abs Immature Granulocytes 0.09 (*)    All other components within normal limits  COMPREHENSIVE METABOLIC PANEL WITH GFR - Abnormal; Notable for the following components:   Sodium 134 (*)    Glucose, Bld 219 (*)    Creatinine, Ser 1.29 (*)    Calcium  8.7 (*)  GFR, Estimated 58 (*)    All other components within normal limits  URINALYSIS, ROUTINE W REFLEX MICROSCOPIC - Abnormal; Notable for the following components:   Color, Urine YELLOW (*)    APPearance CLEAR (*)    Glucose, UA 50 (*)    Hgb urine dipstick LARGE (*)    Leukocytes,Ua SMALL (*)    Bacteria, UA RARE (*)    All other components within normal limits  URINE CULTURE     EKG    RADIOLOGY    PROCEDURES:  Critical Care performed: No  Procedures   MEDICATIONS ORDERED IN ED: Medications  lactated ringers  bolus 1,000 mL (has no administration in time range)  ondansetron  (ZOFRAN ) injection 4 mg (has no administration in time range)  fentaNYL  (SUBLIMAZE ) injection 50 mcg (has no administration in time range)  cefTRIAXone  (ROCEPHIN ) 1 g in sodium chloride  0.9 % 100 mL IVPB (has no administration in time range)     IMPRESSION / MDM / ASSESSMENT AND PLAN / ED  COURSE  I reviewed the triage vital signs and the nursing notes.                              Differential diagnosis includes, but is not limited to, resistant UTI, recurrent UTI, dehydration, postoperative pain  Patient's presentation is most consistent with acute complicated illness / injury requiring diagnostic workup.  Patient is a 73 year old male presenting today for postoperative pain and weakness.  Recently admitted for urosepsis secondary to infected kidney stone.  Had stents placed at that time.  Was discharged on oral antibiotics.  Started having some chills and weakness today.  No true fevers.  Exam rather unremarkable.  Vital signs not meeting sepsis criteria.  UA is equivocal for UTI at this time and appears improved from prior.  White blood cell count slightly uptrending to 13.9.  Otherwise CMP consistent with his baseline.  Patient given 1 L fluids, morphine , and Zofran .  Discussed case with urology.  Urology reviewed chart.  Stated patient was fine for sending off urine culture.  Previously resistant to Bactrim  and Cipro.  Recommending starting on doxycycline .  I gave him one-time dose of ceftriaxone  here while awaiting discharge.  Otherwise no signs of sepsis and stable here in the ED.  Planned procedure in 3 days and was given strict return precautions for any worsening symptoms in the interim.  The patient is on the cardiac monitor to evaluate for evidence of arrhythmia and/or significant heart rate changes. Clinical Course as of 12/07/23 1706  Tue Dec 07, 2023  1626 Spoke with urology team given patient's presentation.  They recommend sending off urine culture and transitioning his Bactrim  to ciprofloxacin.  Otherwise safe for discharge once medications are complete and follow-up as planned in 3 days. [DW]  1704 Patient resistant to Bactrim  and ciprofloxacin last time.  Urology recommending doxycycline .  Will give one-time dose of ceftriaxone  here and send out on doxycycline . [DW]     Clinical Course User Index [DW] Kandee Orion, MD     FINAL CLINICAL IMPRESSION(S) / ED DIAGNOSES   Final diagnoses:  Post-operative pain     Rx / DC Orders   ED Discharge Orders          Ordered    doxycycline  (VIBRAMYCIN ) 50 MG capsule  2 times daily        12/07/23 1706  Note:  This document was prepared using Dragon voice recognition software and may include unintentional dictation errors.   Kandee Orion, MD 12/07/23 [phone]

## 2023-12-07 NOTE — Telephone Encounter (Signed)
 Patient's son and his daughter in law Melissa calling, patient had CYSTOSCOPY, WITH RETROGRADE PYELOGRAM AND URETERAL STENT INSERTION  done with Dr Freddi Jaeger on 5/4 and has another one scheduled with Dr Estanislao Heimlich on 12/10/23 otherwise has not been seen at our office. Melissa states patient has not been out of his bed since coming home from the first procedure due to the pain he is having from the stones and stent, he is lethargic somewhat, he is having chills and sweats but has not taking his actual temperature. He is taking his antibiotic. Patient is hard headed Phillip Galloway and wants to just wait until Friday but they are concerned and wonder if he needs to go to ER now. He is not vomiting but does have diarrhea from antibiotics. Patient did pass a big kidney stone on 5/16.Aaron Aas  Per Dr Estanislao Heimlich- patient has not been seen by him or us , he recommends going to ER to evaluate for persistent urinary infection

## 2023-12-07 NOTE — ED Triage Notes (Signed)
 Pt had L ureteral stent placed 2 weeks ago and was placed on abx (still taking). Pt says he still doesn't feel well. Pt has appointment on Friday in [place] to have more kidney stones removed. Pt endorses pain and general malaise.

## 2023-12-07 NOTE — Discharge Instructions (Signed)
 We have switched your antibiotics per urology's request.  You can pick those up in the morning tomorrow and start taking as prescribed.  Please follow-up with them as planned for your procedure in 3 days.  Please return for any worsening symptoms prior to that.

## 2023-12-09 LAB — URINE CULTURE: Culture: NO GROWTH

## 2023-12-09 MED ORDER — ORAL CARE MOUTH RINSE: 15.0000 mL | Freq: Once | OROMUCOSAL | Status: AC

## 2023-12-09 MED ORDER — CHLORHEXIDINE GLUCONATE 0.12 % MT SOLN
15.0000 mL | Freq: Once | OROMUCOSAL | Status: AC
  Administered 2023-12-10: 15 mL via OROMUCOSAL

## 2023-12-09 MED ORDER — SODIUM CHLORIDE 0.9 % IV SOLN: INTRAVENOUS | Status: DC

## 2023-12-09 MED ORDER — VANCOMYCIN HCL IN DEXTROSE 1-5 GM/200ML-% IV SOLN
1000.0000 mg | INTRAVENOUS | Status: AC
Start: 2023-12-09 — End: 2023-12-10
  Administered 2023-12-10: 1000 mg via INTRAVENOUS

## 2023-12-10 ENCOUNTER — Ambulatory Visit: Admitting: Anesthesiology

## 2023-12-10 ENCOUNTER — Ambulatory Visit
Admission: RE | Admit: 2023-12-10 | Discharge: 2023-12-10 | Disposition: A | Discharge status: Home / Self Care | Attending: Urology | Admitting: Urology

## 2023-12-10 ENCOUNTER — Encounter: Payer: Self-pay | Admitting: Urology

## 2023-12-10 ENCOUNTER — Encounter
Admission: RE | Disposition: A | Discharge status: Home / Self Care | Payer: Self-pay | Source: Home / Self Care | Attending: Urology

## 2023-12-10 ENCOUNTER — Ambulatory Visit

## 2023-12-10 ENCOUNTER — Other Ambulatory Visit: Payer: Self-pay

## 2023-12-10 DIAGNOSIS — N202 Calculus of kidney with calculus of ureter: Secondary | ICD-10-CM | POA: Insufficient documentation

## 2023-12-10 DIAGNOSIS — I10 Essential (primary) hypertension: Secondary | ICD-10-CM | POA: Insufficient documentation

## 2023-12-10 DIAGNOSIS — K219 Gastro-esophageal reflux disease without esophagitis: Secondary | ICD-10-CM | POA: Insufficient documentation

## 2023-12-10 DIAGNOSIS — N201 Calculus of ureter: Secondary | ICD-10-CM | POA: Diagnosis present

## 2023-12-10 DIAGNOSIS — G4730 Sleep apnea, unspecified: Secondary | ICD-10-CM | POA: Insufficient documentation

## 2023-12-10 DIAGNOSIS — E119 Type 2 diabetes mellitus without complications: Secondary | ICD-10-CM | POA: Insufficient documentation

## 2023-12-10 DIAGNOSIS — Z01812 Encounter for preprocedural laboratory examination: Secondary | ICD-10-CM

## 2023-12-10 DIAGNOSIS — J449 Chronic obstructive pulmonary disease, unspecified: Secondary | ICD-10-CM | POA: Insufficient documentation

## 2023-12-10 DIAGNOSIS — Z87442 Personal history of urinary calculi: Secondary | ICD-10-CM | POA: Diagnosis not present

## 2023-12-10 HISTORY — PX: CYSTOSCOPY/URETEROSCOPY/HOLMIUM LASER/STENT PLACEMENT: SHX6546

## 2023-12-10 LAB — GLUCOSE, CAPILLARY
Glucose-Capillary: 159 mg/dL — ABNORMAL HIGH (ref 70–99)
Glucose-Capillary: 169 mg/dL — ABNORMAL HIGH (ref 70–99)

## 2023-12-10 SURGERY — CYSTOSCOPY/URETEROSCOPY/HOLMIUM LASER/STENT PLACEMENT
Anesthesia: General | Laterality: Left

## 2023-12-10 MED ORDER — KETOROLAC TROMETHAMINE 30 MG/ML IJ SOLN
INTRAMUSCULAR | Status: DC | PRN
  Administered 2023-12-10: 15 mg via INTRAVENOUS

## 2023-12-10 MED ORDER — FENTANYL CITRATE (PF) 100 MCG/2ML IJ SOLN
INTRAMUSCULAR | Status: DC | PRN
  Administered 2023-12-10 (×4): 25 ug via INTRAVENOUS

## 2023-12-10 MED ORDER — FENTANYL CITRATE (PF) 100 MCG/2ML IJ SOLN: 25.0000 ug | INTRAMUSCULAR | Status: DC | PRN

## 2023-12-10 MED ORDER — OXYCODONE HCL 5 MG/5ML PO SOLN: 5.0000 mg | Freq: Once | ORAL | Status: DC | PRN

## 2023-12-10 MED ORDER — ROCURONIUM BROMIDE 100 MG/10ML IV SOLN
INTRAVENOUS | Status: DC | PRN
  Administered 2023-12-10: 40 mg via INTRAVENOUS

## 2023-12-10 MED ORDER — EPHEDRINE SULFATE-NACL 50-0.9 MG/10ML-% IV SOSY
PREFILLED_SYRINGE | INTRAVENOUS | Status: DC | PRN
  Administered 2023-12-10: 10 mg via INTRAVENOUS
  Administered 2023-12-10: 5 mg via INTRAVENOUS

## 2023-12-10 MED ORDER — VANCOMYCIN HCL IN DEXTROSE 1-5 GM/200ML-% IV SOLN
INTRAVENOUS | Status: AC
  Filled 2023-12-10: qty 200

## 2023-12-10 MED ORDER — PROPOFOL 10 MG/ML IV BOLUS
INTRAVENOUS | Status: DC | PRN
  Administered 2023-12-10: 100 mg via INTRAVENOUS

## 2023-12-10 MED ORDER — LIDOCAINE HCL (CARDIAC) PF 100 MG/5ML IV SOSY
PREFILLED_SYRINGE | INTRAVENOUS | Status: DC | PRN
  Administered 2023-12-10: 30 mg via INTRAVENOUS

## 2023-12-10 MED ORDER — ONDANSETRON HCL 4 MG/2ML IJ SOLN
INTRAMUSCULAR | Status: DC | PRN
  Administered 2023-12-10: 4 mg via INTRAVENOUS

## 2023-12-10 MED ORDER — ACETAMINOPHEN 500 MG PO TABS
1000.0000 mg | ORAL_TABLET | Freq: Once | ORAL | Status: AC
  Administered 2023-12-10: 1000 mg via ORAL

## 2023-12-10 MED ORDER — LACTATED RINGERS IV SOLN: INTRAVENOUS | Status: DC | PRN

## 2023-12-10 MED ORDER — FENTANYL CITRATE (PF) 100 MCG/2ML IJ SOLN
INTRAMUSCULAR | Status: AC
  Filled 2023-12-10: qty 2

## 2023-12-10 MED ORDER — GABAPENTIN 300 MG PO CAPS
300.0000 mg | ORAL_CAPSULE | Freq: Once | ORAL | Status: AC
  Administered 2023-12-10: 300 mg via ORAL

## 2023-12-10 MED ORDER — MIDAZOLAM HCL 2 MG/2ML IJ SOLN
INTRAMUSCULAR | Status: DC | PRN
  Administered 2023-12-10: 2 mg via INTRAVENOUS

## 2023-12-10 MED ORDER — SODIUM CHLORIDE 0.9 % IR SOLN
Status: DC | PRN
  Administered 2023-12-10: 3000 mL via INTRAVESICAL

## 2023-12-10 MED ORDER — DEXAMETHASONE SODIUM PHOSPHATE 10 MG/ML IJ SOLN
INTRAMUSCULAR | Status: DC | PRN
Start: 2023-12-10 — End: 2023-12-10
  Administered 2023-12-10: 5 mg via INTRAVENOUS

## 2023-12-10 MED ORDER — DROPERIDOL 2.5 MG/ML IJ SOLN: 0.6250 mg | Freq: Once | INTRAMUSCULAR | Status: DC | PRN

## 2023-12-10 MED ORDER — ACETAMINOPHEN 10 MG/ML IV SOLN: 1000.0000 mg | Freq: Once | INTRAVENOUS | Status: DC | PRN

## 2023-12-10 MED ORDER — MIDAZOLAM HCL 2 MG/2ML IJ SOLN
INTRAMUSCULAR | Status: AC
  Filled 2023-12-10: qty 2

## 2023-12-10 MED ORDER — OXYCODONE HCL 5 MG PO TABS: 5.0000 mg | ORAL_TABLET | Freq: Once | ORAL | Status: DC | PRN

## 2023-12-10 MED ORDER — CHLORHEXIDINE GLUCONATE 0.12 % MT SOLN
OROMUCOSAL | Status: AC
  Filled 2023-12-10: qty 15

## 2023-12-10 MED ORDER — SUGAMMADEX SODIUM 200 MG/2ML IV SOLN
INTRAVENOUS | Status: DC | PRN
  Administered 2023-12-10: 200 mg via INTRAVENOUS

## 2023-12-10 MED ORDER — IOHEXOL 180 MG/ML  SOLN
INTRAMUSCULAR | Status: DC | PRN
Start: 2023-12-10 — End: 2023-12-10
  Administered 2023-12-10: 10 mL

## 2023-12-10 MED ORDER — PHENYLEPHRINE 80 MCG/ML (10ML) SYRINGE FOR IV PUSH (FOR BLOOD PRESSURE SUPPORT)
PREFILLED_SYRINGE | INTRAVENOUS | Status: DC | PRN
  Administered 2023-12-10: 80 ug via INTRAVENOUS

## 2023-12-10 MED ORDER — ACETAMINOPHEN 500 MG PO TABS
ORAL_TABLET | ORAL | Status: AC
  Filled 2023-12-10: qty 2

## 2023-12-10 MED ORDER — DOCUSATE SODIUM 100 MG PO CAPS: 100.0000 mg | ORAL_CAPSULE | Freq: Two times a day (BID) | ORAL | 0 refills | Status: AC

## 2023-12-10 MED ORDER — GABAPENTIN 300 MG PO CAPS
ORAL_CAPSULE | ORAL | Status: AC
  Filled 2023-12-10: qty 1

## 2023-12-10 SURGICAL SUPPLY — 25 items
ADHESIVE MASTISOL STRL (MISCELLANEOUS) IMPLANT
BAG DRAIN SIEMENS DORNER NS (MISCELLANEOUS) ×1 IMPLANT
BAG PRESSURE INF REUSE 3000 (BAG) ×1 IMPLANT
BRUSH SCRUB EZ 4% CHG (MISCELLANEOUS) ×1 IMPLANT
CATH URET FLEX-TIP 2 LUMEN 10F (CATHETERS) IMPLANT
CATH URETL OPEN 5X70 (CATHETERS) IMPLANT
CNTNR URN SCR LID CUP LEK RST (MISCELLANEOUS) IMPLANT
DRAPE UTILITY 15X26 TOWEL STRL (DRAPES) ×1 IMPLANT
DRSG TEGADERM 2-3/8X2-3/4 SM (GAUZE/BANDAGES/DRESSINGS) IMPLANT
FIBER LASER MOSES 200 DFL (Laser) IMPLANT
GLOVE BIOGEL PI IND STRL 7.5 (GLOVE) ×1 IMPLANT
GOWN STRL REUS W/ TWL LRG LVL3 (GOWN DISPOSABLE) ×1 IMPLANT
GOWN STRL REUS W/ TWL XL LVL3 (GOWN DISPOSABLE) ×1 IMPLANT
GUIDEWIRE STR DUAL SENSOR (WIRE) ×1 IMPLANT
KIT TURNOVER CYSTO (KITS) ×1 IMPLANT
PACK CYSTO AR (MISCELLANEOUS) ×1 IMPLANT
SET CYSTO W/LG BORE CLAMP LF (SET/KITS/TRAYS/PACK) ×1 IMPLANT
SHEATH NAVIGATOR HD 12/14X36 (SHEATH) IMPLANT
SOL .9 NS 3000ML IRR UROMATIC (IV SOLUTION) ×1 IMPLANT
STENT URET 6FRX24 CONTOUR (STENTS) IMPLANT
STENT URET 6FRX26 CONTOUR (STENTS) IMPLANT
SURGILUBE 2OZ TUBE FLIPTOP (MISCELLANEOUS) ×1 IMPLANT
SYR 10ML LL (SYRINGE) ×1 IMPLANT
VALVE UROSEAL ADJ ENDO (VALVE) IMPLANT
WATER STERILE IRR 500ML POUR (IV SOLUTION) ×1 IMPLANT

## 2023-12-10 NOTE — Op Note (Signed)
 Date of procedure: 12/10/23  Preoperative diagnosis:  Left distal ureteral stone Left renal stones  Postoperative diagnosis:  Same  Procedure: Cystoscopy, left retrograde pyelogram with intraoperative interpretation Left ureteroscopy, laser lithotripsy of distal ureteral stone Left ureteroscopy and pyeloscopy, laser lithotripsy of left renal stones Left ureteral stent placement  Surgeon: Jay Meth, MD  Anesthesia: General  Complications: None  Intraoperative findings:  Normal-appearing bladder, uncomplicated dusting of small left distal ureteral stone and left renal stones Stent placed with Dangler  EBL: None  Specimens: None  Drains: Left 6 French by 26 cm ureteral stent  Indication: Phillip LEBEAU Sr. is a 73 y.o. patient who previously presented with left distal ureteral stone and sepsis from urinary source and underwent urgent stent placement by Dr. Freddi Jaeger.  Here today for definitive management with ureteroscopy most recent urine culture was negative..  After reviewing the management options for treatment, they elected to proceed with the above surgical procedure(s). We have discussed the potential benefits and risks of the procedure, side effects of the proposed treatment, the likelihood of the patient achieving the goals of the procedure, and any potential problems that might occur during the procedure or recuperation. Informed consent has been obtained.  Description of procedure:  The patient was taken to the operating room and general anesthesia was induced. SCDs were placed for DVT prophylaxis. The patient was placed in the dorsal lithotomy position, prepped and draped in the usual sterile fashion, and preoperative antibiotics(vancomycin ) were administered. A preoperative time-out was performed.   21 French rigid cystoscope was used to intubate the urethra and the urethra was followed proximally into the bladder.  There was some subtle narrowing throughout the urethra but  easily accommodated the 21 [place] scope.  Prostate was small to moderate in size.  Thorough cystoscopy showed no suspicious lesions, and ureteral orifices were orthotopic bilaterally.  A sensor wire was advanced alongside the left ureteral stent and the left ureteral stent was removed.  A semirigid long ureteroscope was advanced alongside the wire and a 4 mm stone was noted in the distal ureter.  A 200 m laser fiber on settings of 1.0 J and 10 Hz was used to fragment the stone to small pieces, and these were all irrigated free from the ureter.  A digital single-channel flexible ureteroscope was then advanced over the wire up to the kidney under fluoroscopic vision.  Thorough pyeloscopy showed small calyceal stones in the mid and upper pole, and 2 larger stones in the lower pole.  The 200 m laser fiber on settings of 0.5 J and 80 Hz was used to methodically dust all stones.  Thorough pyeloscopy showed no other stone fragments larger than the laser fiber.  I retrograde pyelogram performed from the proximal ureter showed no extravasation or filling defects.  Pullback ureteroscopy showed no residual stones or ureteral injury.  A wire was replaced through the scope.  A rigid cystoscope was backloaded over the wire and a 6 [place] by 26 cm ureteral stent was placed uneventfully with a curl in the upper pole, as well as in the bladder.  The Dangler was secured to the dorsal aspect of the penis using Mastisol and Tegaderm.  The bladder was drained and this concluded our procedure.  Disposition: Stable to PACU  Plan: Can remove stent at home on Wednesday morning Finish antibiotics previously prescribed by ER Consider 24-hour urine metabolic workup at follow-up, RTC 3 months with MD  Jay Meth, MD

## 2023-12-10 NOTE — H&P (Signed)
   12/10/23 9:55 AM   Dellia Ferguson Sr. [birth year] [id number]  CC: Left ureteral stone, prestented for infection  HPI: 73 year old comorbid male who previously presented 11/21/2023 with fever and left-sided ureteral stone, concern for sepsis from urinary source and underwent urgent stent placement with Dr. Freddi Jaeger.  Discharged on cefadroxil .  Here today for definitive management.  He was also seen in the ER on 12/07/2023 with fatigue and malaise, urine culture was ultimately negative, he was started on doxycycline  based on prior culture results.   PMH: Past Medical History:  Diagnosis Date   Complication of anesthesia    SLIGHTLY hard to wake up   Diabetes mellitus, type 2 (HCC)    GERD (gastroesophageal reflux disease)    occ-no meds   Gout    History of kidney stones    h/o   Hyperlipidemia, mixed    Hypertension    Left ureteral stone 11/2023   Severe sepsis (HCC) 11/21/2023   Staphylococcus epidermidis bacteremia    Surgical History: Past Surgical History:  Procedure Laterality Date   CATARACT EXTRACTION W/PHACO Left 09/10/2020   Procedure: CATARACT EXTRACTION PHACO AND INTRAOCULAR LENS PLACEMENT (IOC) LEFT DIABETIC 4.20 00:32.2;  Surgeon: Clair Crews, MD;  Location: [facility];  Service: Ophthalmology;  Laterality: Left;  Diabetic - oral meds   CHOLECYSTECTOMY     COLONOSCOPY WITH PROPOFOL  N/A 04/22/2023   Procedure: COLONOSCOPY WITH PROPOFOL ;  Surgeon: Selena Daily, MD;  Location: [facility];  Service: Gastroenterology;  Laterality: N/A;   CYSTOSCOPY W/ URETERAL STENT PLACEMENT Left 11/21/2023   Procedure: CYSTOSCOPY, WITH RETROGRADE PYELOGRAM AND URETERAL STENT INSERTION;  Surgeon: Lahoma Pigg, MD;  Location: [facility];  Service: Urology;  Laterality: Left;   CYSTOSCOPY W/ URETERAL STENT PLACEMENT  2000   PATELLAR TENDON REPAIR Right 01/06/2018   Procedure: RIGHT KNEE PREPATELLAR BURSA EXCISION WITH ANTERIOR PATELLA SPUR EXCISION;  Surgeon: Molli Angelucci, MD;  Location: [facility];  Service: Orthopedics;  Laterality: Right;   WRIST SURGERY Left    s/p fall on concrete; has plate in place    Family History: No family history on file.  Social History:  reports that he has never smoked. He has never used smokeless tobacco. He reports that he does not drink alcohol and does not use drugs.  Physical Exam:  Constitutional:  Alert and oriented, No acute distress. Cardiovascular: Regular rate and rhythm Respiratory: Clear to auscultation bilaterally GI: Abdomen is soft, nontender, nondistended, no abdominal masses  Laboratory Data: Urine culture 5/4 Staph epidermidis  Urine culture 5/20 no growth  Assessment & Plan:   73 year old male previously presented with sepsis from urinary source and left distal ureteral stone, underwent urgent stent placement at that time with Dr. Freddi Jaeger.  Here today for definitive management with ureteroscopy.  Also has small left renal stones that will be addressed today.  We specifically discussed the risks ureteroscopy including bleeding, infection/sepsis, stent related symptoms including flank pain/urgency/frequency/incontinence/dysuria, ureteral injury, ureteral stricture, inability to access stone, or need for staged or additional procedures.  Left ureteroscopy, laser lithotripsy, stent placement today   Jay Meth, MD 12/10/2023  [facility] [address], [address] [place], [place] [address] [phone]

## 2023-12-10 NOTE — Anesthesia Preprocedure Evaluation (Addendum)
 Anesthesia Evaluation  Patient identified by MRN, date of birth, ID band Patient awake    Reviewed: Allergy & Precautions, NPO status , Patient's Chart, lab work & pertinent test results  History of Anesthesia Complications (+) PROLONGED EMERGENCE and history of anesthetic complications  Airway Mallampati: III  TM Distance: <3 FB Neck ROM: full    Dental  (+) Chipped, Poor Dentition, Missing, Loose   Pulmonary neg shortness of breath, sleep apnea , COPD   Pulmonary exam normal        Cardiovascular Exercise Tolerance: Good hypertension, (-) angina (-) Past MI and (-) DOE Normal cardiovascular exam     Neuro/Psych negative neurological ROS  negative psych ROS   GI/Hepatic Neg liver ROS,GERD  Controlled,,  Endo/Other  diabetes, Type 2    Renal/GU Renal disease     Musculoskeletal   Abdominal Normal abdominal exam  (+)   Peds  Hematology negative hematology ROS (+)   Anesthesia Other Findings Recent sepsis due to UTI. Pt on antibiotics. Complains of persistent fatigue.   Past Medical History: No date: Complication of anesthesia     Comment:  SLIGHTLY hard to wake up No date: Diabetes mellitus, type 2 (HCC) No date: GERD (gastroesophageal reflux disease)     Comment:  occ-no meds No date: History of kidney stones     Comment:  h/o No date: Hypertension  Past Surgical History: 09/10/2020: CATARACT EXTRACTION W/PHACO; Left     Comment:  Procedure: CATARACT EXTRACTION PHACO AND INTRAOCULAR               LENS PLACEMENT (IOC) LEFT DIABETIC 4.20 00:32.2;                Surgeon: Clair Crews, MD;  Location: [facility];  Service: Ophthalmology;  Laterality: Left;                Diabetic - oral meds No date: CHOLECYSTECTOMY 04/22/2023: COLONOSCOPY WITH PROPOFOL ; N/A     Comment:  Procedure: COLONOSCOPY WITH PROPOFOL ;  Surgeon: Selena Daily, MD;  Location: [facility];   Service:               Gastroenterology;  Laterality: N/A; 01/06/2018: PATELLAR TENDON REPAIR; Right     Comment:  Procedure: RIGHT KNEE PREPATELLAR BURSA EXCISION WITH               ANTERIOR PATELLA SPUR EXCISION;  Surgeon: Molli Angelucci,               MD;  Location: [facility];  Service: Orthopedics;                Laterality: Right; No date: WRIST SURGERY; Left  BMI    Body Mass Index: 29.11 kg/m      Reproductive/Obstetrics negative OB ROS                              Anesthesia Physical Anesthesia Plan  ASA: 3  Anesthesia Plan: General   Post-op Pain Management:    Induction: Intravenous  PONV Risk Score and Plan: Dexamethasone , Ondansetron  and Treatment may vary due to age or medical condition  Airway Management Planned: Oral ETT  Additional Equipment:   Intra-op Plan:   Post-operative Plan: Extubation in OR  Informed Consent: I have reviewed  the patients History and Physical, chart, labs and discussed the procedure including the risks, benefits and alternatives for the proposed anesthesia with the patient or authorized representative who has indicated his/her understanding and acceptance.     Dental Advisory Given  Plan Discussed with: Anesthesiologist, CRNA and Surgeon  Anesthesia Plan Comments:         Anesthesia Quick Evaluation

## 2023-12-10 NOTE — Anesthesia Postprocedure Evaluation (Signed)
 Anesthesia Post Note  Patient: Phillip SPRADLEY Sr.  Procedure(s) Performed: CYSTOSCOPY/URETEROSCOPY/HOLMIUM LASER/STENT PLACEMENT (Left)  Patient location during evaluation: PACU Anesthesia Type: General Level of consciousness: awake and alert Pain management: pain level controlled Vital Signs Assessment: post-procedure vital signs reviewed and stable Respiratory status: spontaneous breathing, nonlabored ventilation and respiratory function stable Cardiovascular status: blood pressure returned to baseline and stable Postop Assessment: no apparent nausea or vomiting Anesthetic complications: no   No notable events documented.   Last Vitals:  Vitals:   12/10/23 1330 12/10/23 1409  BP: 133/84 132/85  Pulse: 81 70  Resp: 16 16  Temp:    SpO2: 91% 97%    Last Pain:  Vitals:   12/10/23 1409  TempSrc:   PainSc: 0-No pain                 Baltazar Bonier

## 2023-12-10 NOTE — Anesthesia Procedure Notes (Signed)
 Procedure Name: Intubation Date/Time: 12/10/2023 12:25 PM  Performed by: Bill Budd, CRNAPre-anesthesia Checklist: Patient identified, Patient being monitored, Timeout performed, Emergency Drugs available and Suction available Patient Re-evaluated:Patient Re-evaluated prior to induction Oxygen Delivery Method: Circle system utilized Preoxygenation: Pre-oxygenation with 100% oxygen Induction Type: IV induction Ventilation: Mask ventilation without difficulty Laryngoscope Size: 4, Glidescope and McGrath Grade View: Grade I Tube type: Oral Tube size: 7.5 mm Number of attempts: 1 Airway Equipment and Method: Stylet Placement Confirmation: ETT inserted through vocal cords under direct vision, positive ETCO2 and breath sounds checked- equal and bilateral Secured at: 21 cm Tube secured with: Tape Dental Injury: Teeth and Oropharynx as per pre-operative assessment

## 2023-12-10 NOTE — Transfer of Care (Signed)
 Immediate Anesthesia Transfer of Care Note  Patient: DELDRICK LINCH Sr.  Procedure(s) Performed: CYSTOSCOPY/URETEROSCOPY/HOLMIUM LASER/STENT PLACEMENT (Left)  Patient Location: PACU  Anesthesia Type:General  Level of Consciousness: awake and alert   Airway & Oxygen Therapy: Patient Spontanous Breathing and Patient connected to face mask oxygen  Post-op Assessment: Report given to RN and Post -op Vital signs reviewed and stable  Post vital signs: Reviewed and stable  Last Vitals:  Vitals Value Taken Time  BP 141/91 12/10/23 1303  Temp 36.5 C 12/10/23 1303  Pulse 82 12/10/23 1309  Resp 17 12/10/23 1309  SpO2 95 % 12/10/23 1309  Vitals shown include unfiled device data.  Last Pain:  Vitals:   12/10/23 1303  TempSrc:   PainSc: Asleep         Complications: No notable events documented.

## 2023-12-11 ENCOUNTER — Encounter: Payer: Self-pay | Admitting: Urology

## 2024-01-07 ENCOUNTER — Encounter: Payer: Self-pay | Admitting: Urology

## 2024-02-01 ENCOUNTER — Encounter: Payer: Self-pay | Admitting: Urology

## 2024-03-08 ENCOUNTER — Ambulatory Visit: Admitting: Urology

## 2024-03-09 ENCOUNTER — Ambulatory Visit: Admitting: Urology
